# Patient Record
Sex: Female | Born: 1985 | Race: Black or African American | Hispanic: No | Marital: Married | Smoking: Former smoker
Health system: Southern US, Community
[De-identification: ages and names within clinical notes are randomized; demographics above are authoritative.]

## PROBLEM LIST (undated history)

## (undated) ENCOUNTER — Emergency Department (HOSPITAL_COMMUNITY): Disposition: A | Discharge status: Home / Self Care | Payer: Medicaid Other

## (undated) DIAGNOSIS — O34219 Maternal care for unspecified type scar from previous cesarean delivery: Secondary | ICD-10-CM

## (undated) DIAGNOSIS — N751 Abscess of Bartholin's gland: Secondary | ICD-10-CM

## (undated) DIAGNOSIS — I10 Essential (primary) hypertension: Secondary | ICD-10-CM

## (undated) DIAGNOSIS — O24419 Gestational diabetes mellitus in pregnancy, unspecified control: Secondary | ICD-10-CM

## (undated) DIAGNOSIS — A599 Trichomoniasis, unspecified: Secondary | ICD-10-CM

## (undated) DIAGNOSIS — N921 Excessive and frequent menstruation with irregular cycle: Secondary | ICD-10-CM

## (undated) HISTORY — DX: Excessive and frequent menstruation with irregular cycle: N92.1

## (undated) HISTORY — PX: WISDOM TOOTH EXTRACTION: SHX21

## (undated) HISTORY — DX: Abscess of Bartholin's gland: N75.1

## (undated) HISTORY — DX: Maternal care for unspecified type scar from previous cesarean delivery: O34.219

## (undated) HISTORY — DX: Gestational diabetes mellitus in pregnancy, unspecified control: O24.419

---

## 2010-10-09 ENCOUNTER — Inpatient Hospital Stay (HOSPITAL_COMMUNITY)
Admission: AD | Admit: 2010-10-09 | Discharge: 2010-10-09 | Disposition: A | Discharge status: Home / Self Care | Payer: Medicaid Other | Source: Ambulatory Visit | Attending: Obstetrics & Gynecology | Admitting: Obstetrics & Gynecology

## 2010-10-09 ENCOUNTER — Encounter (HOSPITAL_COMMUNITY): Payer: Self-pay | Admitting: Radiology

## 2010-10-09 ENCOUNTER — Inpatient Hospital Stay (HOSPITAL_COMMUNITY): Payer: Medicaid Other

## 2010-10-09 DIAGNOSIS — R109 Unspecified abdominal pain: Secondary | ICD-10-CM | POA: Insufficient documentation

## 2010-10-09 DIAGNOSIS — O00109 Unspecified tubal pregnancy without intrauterine pregnancy: Secondary | ICD-10-CM

## 2010-10-09 LAB — HCG, QUANTITATIVE, PREGNANCY: hCG, Beta Chain, Quant, S: 389 m[IU]/mL — ABNORMAL HIGH (ref <5)

## 2010-10-09 LAB — DIFFERENTIAL
Basophils Absolute: 0 10*3/uL (ref 0.0–0.1)
Lymphocytes Relative: 26 % (ref 12–46)
Monocytes Absolute: 0.8 10*3/uL (ref 0.1–1.0)
Monocytes Relative: 7 % (ref 3–12)
Neutro Abs: 8 10*3/uL — ABNORMAL HIGH (ref 1.7–7.7)
Neutrophils Relative %: 66 % (ref 43–77)

## 2010-10-09 LAB — CREATININE, SERUM
Creatinine, Ser: 0.59 mg/dL (ref 0.4–1.2)
GFR calc Af Amer: >60 mL/min (ref >60)

## 2010-10-09 LAB — WET PREP, GENITAL
Trich, Wet Prep: NONE SEEN
Yeast Wet Prep HPF POC: NONE SEEN

## 2010-10-09 LAB — URINALYSIS, ROUTINE W REFLEX MICROSCOPIC
Bilirubin Urine: NEGATIVE
Ketones, ur: NEGATIVE mg/dL
Nitrite: POSITIVE — AB
Protein, ur: NEGATIVE mg/dL
Urobilinogen, UA: 0.2 mg/dL (ref 0.0–1.0)

## 2010-10-09 LAB — CBC
HCT: 37 % (ref 36.0–46.0)
Platelets: 263 10*3/uL (ref 150–400)
RDW: 13.4 % (ref 11.5–15.5)
WBC: 11.5 10*3/uL — ABNORMAL HIGH (ref 4.0–10.5)

## 2010-10-09 LAB — ABO/RH: ABO/RH(D): B POS

## 2010-10-09 LAB — BUN: BUN: 8 mg/dL (ref 6–23)

## 2010-10-09 LAB — POCT PREGNANCY, URINE: Preg Test, Ur: POSITIVE

## 2010-10-10 LAB — GC/CHLAMYDIA PROBE AMP, GENITAL
Chlamydia, DNA Probe: NEGATIVE
GC Probe Amp, Genital: NEGATIVE

## 2010-10-11 LAB — URINE CULTURE
Colony Count: >=100000
Culture  Setup Time: 201204171852

## 2010-10-12 ENCOUNTER — Inpatient Hospital Stay (HOSPITAL_COMMUNITY)
Admission: AD | Admit: 2010-10-12 | Discharge: 2010-10-12 | Disposition: A | Discharge status: Home / Self Care | Payer: Medicaid Other | Source: Ambulatory Visit | Attending: Obstetrics and Gynecology | Admitting: Obstetrics and Gynecology

## 2010-10-12 DIAGNOSIS — O00109 Unspecified tubal pregnancy without intrauterine pregnancy: Secondary | ICD-10-CM | POA: Insufficient documentation

## 2012-02-10 ENCOUNTER — Inpatient Hospital Stay (HOSPITAL_COMMUNITY)
Admission: AD | Admit: 2012-02-10 | Discharge: 2012-02-10 | Disposition: A | Discharge status: Home / Self Care | Payer: Self-pay | Source: Ambulatory Visit | Attending: Obstetrics & Gynecology | Admitting: Obstetrics & Gynecology

## 2012-02-10 ENCOUNTER — Encounter (HOSPITAL_COMMUNITY): Payer: Self-pay | Admitting: *Deleted

## 2012-02-10 DIAGNOSIS — A084 Viral intestinal infection, unspecified: Secondary | ICD-10-CM

## 2012-02-10 DIAGNOSIS — N921 Excessive and frequent menstruation with irregular cycle: Secondary | ICD-10-CM | POA: Diagnosis present

## 2012-02-10 DIAGNOSIS — A088 Other specified intestinal infections: Secondary | ICD-10-CM | POA: Insufficient documentation

## 2012-02-10 HISTORY — DX: Excessive and frequent menstruation with irregular cycle: N92.1

## 2012-02-10 LAB — WET PREP, GENITAL: Trich, Wet Prep: NONE SEEN

## 2012-02-10 LAB — CBC
MCH: 32.3 pg (ref 26.0–34.0)
MCHC: 32.3 g/dL (ref 30.0–36.0)
MCV: 100 fL (ref 78.0–100.0)
Platelets: 284 10*3/uL (ref 150–400)
RBC: 4 MIL/uL (ref 3.87–5.11)
RDW: 13.3 % (ref 11.5–15.5)

## 2012-02-10 MED ORDER — ONDANSETRON HCL 4 MG PO TABS: 4.0000 mg | ORAL_TABLET | Freq: Three times a day (TID) | ORAL | Status: AC | PRN

## 2012-02-10 NOTE — MAU Note (Signed)
WAS AT CLINIC IN 2012- STD CHECK- AND BC PILLS - BUT GOT PREG WITH ECTOPIC

## 2012-02-10 NOTE — MAU Note (Signed)
PINK ON TISSUE TODAY.

## 2012-02-10 NOTE — MAU Note (Signed)
PT SAYS ON 7-23- SHE STARTED  FEELING   N/V-    CRAMPING- FEELS FLUTTER,  SLEEPY,  FREQ VOIDING.   ,   AUG CYCLE - WAS NOT NL- HAD CRAMPS ONLY X1 HR.

## 2012-02-10 NOTE — MAU Provider Note (Signed)
Cynthia Schultz y.o.G2P0 @Unknown  by LMP Chief Complaint  Patient presents with  . Nausea     First Provider Initiated Contact with Patient 02/10/12 2312      SUBJECTIVE  HPI: Pt presents to MAU with nausea since June 21, diarrhea x3 weeks, and left flank and left mid abdomen pain at umbilicus.  Patient's last menstrual period was 01/14/2012.  This period was shorter and lighter and only 2 weeks after her normal period.  She had light bleeding on 8/13 also but not a normal period for her.  She had these symptoms in her first pregnancy so she took a pregnancy test 2 weeks ago which was positive.  After this test, she has had 3 negative home pregnancy tests.    Past Medical History  Diagnosis Date  . Complication of anesthesia    Past Surgical History  Procedure Date  . Cesarean section    History   Social History  . Marital Status: Single    Spouse Name: N/A    Number of Children: N/A  . Years of Education: N/A   Occupational History  . Not on file.   Social History Main Topics  . Smoking status: Current Everyday Smoker -- 1.0 packs/day    Types: Cigarettes  . Smokeless tobacco: Not on file  . Alcohol Use: 1.8 oz/week    1 Glasses of wine, 1 Cans of beer, 1 Shots of liquor per week     LAST TIME WA SAT  . Drug Use: Yes    Special: Marijuana  . Sexually Active: Yes   Other Topics Concern  . Not on file   Social History Narrative  . No narrative on file   No current facility-administered medications on file prior to encounter.   No current outpatient prescriptions on file prior to encounter.   No Known Allergies  ROS: Pertinent items in HPI  OBJECTIVE Blood pressure 123/78, pulse 116, temperature 98 F (36.7 C), temperature source Oral, resp. rate 20, height 5\' 6"  (1.676 m), weight 99.961 kg (220 lb 6 oz), last menstrual period 01/14/2012, unknown if currently breastfeeding.  GENERAL: Well-developed, well-nourished female in no acute distress.  HEENT:  Normocephalic, good dentition HEART: normal rate RESP: normal effort ABDOMEN: Soft, nontender EXTREMITIES: Nontender, no edema NEURO: Alert and oriented Pelvic exam: Cervix pink, visually closed, without lesion, scant white creamy discharge, vaginal walls and external genitalia normal Bimanual exam: Cervix 0/long/high, firm, anterior, neg CMT, uterus nontender, nonenlarged, adnexa without tenderness, enlargement, or mass  Neg CVA tenderness  LAB RESULTS  Results for orders placed during the hospital encounter of 02/10/12 (from the past 24 hour(s))  POCT PREGNANCY, URINE     Status: Normal   Collection Time   02/10/12  9:36 PM      Component Value Range   Preg Test, Ur NEGATIVE  NEGATIVE   Results for orders placed during the hospital encounter of 02/10/12 (from the past 72 hour(s))  POCT PREGNANCY, URINE     Status: Normal   Collection Time   02/10/12  9:36 PM      Component Value Range Comment   Preg Test, Ur NEGATIVE  NEGATIVE   GC/CHLAMYDIA PROBE AMP, GENITAL     Status: Normal   Collection Time   02/10/12 11:13 PM      Component Value Range Comment   GC Probe Amp, Genital NEGATIVE  NEGATIVE    Chlamydia, DNA Probe NEGATIVE  NEGATIVE   WET PREP, GENITAL     Status: Abnormal  Collection Time   02/10/12 11:13 PM      Component Value Range Comment   Yeast Wet Prep HPF POC NONE SEEN  NONE SEEN    Trich, Wet Prep NONE SEEN  NONE SEEN    Clue Cells Wet Prep HPF POC MANY (*) NONE SEEN    WBC, Wet Prep HPF POC MODERATE (*) NONE SEEN MODERATE BACTERIA SEEN  HCG, QUANTITATIVE, PREGNANCY     Status: Normal   Collection Time   02/10/12 11:15 PM      Component Value Range Comment   hCG, Beta Chain, Quant, S <1  <5 mIU/mL   CBC     Status: Abnormal   Collection Time   02/10/12 11:15 PM      Component Value Range Comment   WBC 11.3 (*) 4.0 - 10.5 K/uL    RBC 4.00  3.87 - 5.11 MIL/uL    Hemoglobin 12.9  12.0 - 15.0 g/dL    HCT 16.1  09.6 - 04.5 %    MCV 100.0  78.0 - 100.0 fL     MCH 32.3  26.0 - 34.0 pg    MCHC 32.3  30.0 - 36.0 g/dL    RDW 40.9  81.1 - 91.4 %    Platelets 284  150 - 400 K/uL     ASSESSMENT 1. Viral gastroenteritis   2. Metrorrhagia     PLAN  D/C home Zofran 4-8 mg PO Q 8 hours F/U with primary care Bland GI upset diet and drink plenty of fluids Return to MAU as needed  Medication List  As of 02/13/2012  9:22 AM   TAKE these medications         ondansetron 4 MG tablet   Commonly known as: ZOFRAN   Take 1-2 tablets (4-8 mg total) by mouth every 8 (eight) hours as needed for nausea.             LEFTWICH-KIRBY, LISA 02/10/2012 11:19 PM

## 2012-02-10 NOTE — MAU Note (Signed)
PT WANTED TO LEAVE -  FOR WORK IN AM

## 2012-02-10 NOTE — MAU Note (Signed)
HAD 1 ECTOPIC IN 09-2010-      HAD METHOTREXATE-  NO DR VISIT  SINCE.Marland Kitchen  HAD 1 POSTIVE PREG TEST  ON 2 WEEKS AGO - THEN 3 OTHER  WERE NEG.  NO BIRTH CONTROL.  LAST SEX- 2 WEEKS AGO.

## 2012-02-11 LAB — GC/CHLAMYDIA PROBE AMP, GENITAL
Chlamydia, DNA Probe: NEGATIVE
GC Probe Amp, Genital: NEGATIVE

## 2012-02-20 NOTE — MAU Provider Note (Signed)
Medical Screening exam and patient care preformed by advanced practice provider.  Agree with the above management.  

## 2012-02-28 ENCOUNTER — Encounter: Payer: Medicaid Other | Admitting: Advanced Practice Midwife

## 2014-04-25 ENCOUNTER — Encounter (HOSPITAL_COMMUNITY): Payer: Self-pay | Admitting: *Deleted

## 2016-01-08 ENCOUNTER — Encounter (HOSPITAL_COMMUNITY): Payer: Self-pay

## 2016-01-08 ENCOUNTER — Inpatient Hospital Stay (HOSPITAL_COMMUNITY)
Admission: AD | Admit: 2016-01-08 | Discharge: 2016-01-08 | Disposition: A | Discharge status: Home / Self Care | Payer: Self-pay | Source: Ambulatory Visit | Attending: Obstetrics and Gynecology | Admitting: Obstetrics and Gynecology

## 2016-01-08 DIAGNOSIS — N751 Abscess of Bartholin's gland: Secondary | ICD-10-CM

## 2016-01-08 DIAGNOSIS — F17210 Nicotine dependence, cigarettes, uncomplicated: Secondary | ICD-10-CM | POA: Insufficient documentation

## 2016-01-08 DIAGNOSIS — I10 Essential (primary) hypertension: Secondary | ICD-10-CM | POA: Insufficient documentation

## 2016-01-08 HISTORY — DX: Essential (primary) hypertension: I10

## 2016-01-08 MED ORDER — OXYCODONE-ACETAMINOPHEN 5-325 MG PO TABS: 1.0000 | ORAL_TABLET | Freq: Once | ORAL | Status: DC

## 2016-01-08 MED ORDER — LIDOCAINE HCL 2 % EX GEL
1.0000 "application " | Freq: Once | CUTANEOUS | Status: AC
  Administered 2016-01-08: 1 via TOPICAL
  Filled 2016-01-08: qty 5

## 2016-01-08 MED ORDER — OXYCODONE-ACETAMINOPHEN 5-325 MG PO TABS: 2.0000 | ORAL_TABLET | Freq: Once | ORAL | Status: DC

## 2016-01-08 NOTE — Discharge Instructions (Signed)
Bartholin Cyst or Abscess A Bartholin cyst is a fluid-filled sac that forms on a Bartholin gland. Bartholin glands are small glands that are located within the folds of skin (labia) along the sides of the lower opening of the vagina. These glands produce a fluid to moisten the outside of the vagina during sexual intercourse. A Bartholin cyst causes a bulge on the side of the vagina. A cyst that is not large or infected may not cause symptoms or problems. However, if the fluid within the cyst becomes infected, the cyst can turn into an abscess. An abscess may cause discomfort or pain. CAUSES A Bartholin cyst may develop when the duct of the gland becomes blocked. In many cases, the cause of this is not known. Various kinds of bacteria can cause the cyst to become infected and develop into an abscess. RISK FACTORS You may be at an increased risk of developing a Bartholin cyst or abscess if:  You are a woman of reproductive age.  You have a history of previous Bartholin cysts or abscesses.  You have diabetes.  You have a sexually transmitted disease (STD). SIGNS AND SYMPTOMS The severity of symptoms varies depending on the size of the cyst and whether it is infected. Symptoms may include:  A bulge or swelling near the lower opening of your vagina.  Discomfort or pain.  Redness.  Pain during sexual intercourse.  Pain when walking.  Fluid draining from the area. DIAGNOSIS Your health care provider may make a diagnosis based on your symptoms and a physical exam. He or she will look for swelling in your vaginal area. Blood tests may be done to check for infections. A sample of fluid from the cyst or abscess may also be taken to be tested in a lab. TREATMENT Small cysts that are not infected may not require any treatment. These often go away on their own. Yourhealth care provider will recommend hot baths and the use of warm compresses. These may also be part of the treatment for an abscess.  Treatment options for a large cyst or abscess may include:   Antibiotic medicine.  A surgical procedure to drain the abscess. One of the following procedures may be done:  Incision and drainage. An incision is made in the cyst or abscess so that the fluid drains out. A catheter may be placed inside the cyst so that it does not close and fill up with fluid again. The catheter will be removed after you have a follow-up visit with a specialist (gynecologist).  Marsupialization. The cyst or abscess is opened and kept open by stitching the edges of the skin to the walls of the cyst or abscess. This allows it to continue to drain and not fill up with fluid again. If you have cysts or abscesses that keep returning and have required incision and drainage multiple times, your health care provider may talk to you about surgery to remove the Bartholin gland. HOME CARE INSTRUCTIONS  Take medicines only as directed by your health care provider.  If you were prescribed an antibiotic medicine, finish it all even if you start to feel better.  Apply warm, wet compresses to the area or take warm, shallow baths that cover your pelvic region (sitz baths) several times a day or as directed by your health care provider.  Do not squeeze the cyst or apply heavy pressure to it.  Do not have sexual intercourse until the cyst has gone away.  If your cyst or abscess was   opened, a small piece of gauze or a drain may have been placed in the area to allow drainage. Do not remove the gauze or the drain until directed by your health care provider.  Wear feminine pads--not tampons--as needed for any drainage or bleeding.  Keep all follow-up visits as directed by your health care provider. This is important. PREVENTION Take these steps to help prevent a Bartholin cyst from returning:  Practice good hygiene.   Clean your vaginal area with mild soap and a soft cloth when you bathe.  Practice safe sex to prevent  STDs. SEEK MEDICAL CARE IF:  You have increased pain, swelling, or redness in the area of the cyst.  Puslike drainage is coming from the cyst.  You have a fever.   This information is not intended to replace advice given to you by your health care provider. Make sure you discuss any questions you have with your health care provider.   Document Released: 06/10/2005 Document Revised: 07/01/2014 Document Reviewed: 01/24/2014 Elsevier Interactive Patient Education 2016 [employer] Inc[employer]  

## 2016-01-08 NOTE — MAU Note (Signed)
Informed consent signed by patient 

## 2016-01-08 NOTE — MAU Provider Note (Signed)
History     CSN: [id number]  Arrival date and time: 01/08/16 2100   First Provider Initiated Contact with Patient 01/08/16 2126      Chief Complaint  Patient presents with  . Vaginal Pain   HPI    [name] is a 30 y.o. female G13P1001 non-pregnant female here with a left bartholin's gland. She noted swelling and pain in that area 4 days ago. She has never had this happen before. She denies fever. She has not taken anything for the pain. She has tried warm compresses to the area.   OB History    Gravida Para Term Preterm AB TAB SAB Ectopic Multiple Living   Past Medical History  Diagnosis Date  . Complication of anesthesia   . Hypertension     Past Surgical History  Procedure Laterality Date  . Cesarean section      Family History  Problem Relation Age of Onset  . Diabetes Mother   . Cancer Mother     Social History  Substance Use Topics  . Smoking status: Current Every Day Smoker -- 1.00 packs/day    Types: Cigarettes  . Smokeless tobacco: None  . Alcohol Use: 1.8 oz/week    1 Glasses of wine, 1 Cans of beer, 1 Shots of liquor per week     Comment: LAST TIME [facility] SAT    Allergies: No Known Allergies  Prescriptions prior to admission  Medication Sig Dispense Refill Last Dose  . acetaminophen (TYLENOL) 500 MG tablet Take 1,000 mg by mouth every 6 (six) hours as needed for mild pain or headache.   01/07/2016 at Unknown time   No results found for this or any previous visit (from the past 48 hour(s)).  Review of Systems  Constitutional: Negative for fever and chills.  Gastrointestinal: Negative for nausea and vomiting.   Physical Exam   Blood pressure 153/105, pulse 106, temperature 98.6 F (37 C), temperature source Oral, resp. rate 20, height  (1.702 m), weight 222 lb (100.699 kg), last menstrual period 12/20/2015, SpO2 97 %, unknown if currently breastfeeding.  Physical Exam  Constitutional: She is oriented to person,  place, and time. She appears well-developed and well-nourished. No distress.  HENT:  Head: Normocephalic.  Eyes: Pupils are equal, round, and reactive to light.  Genitourinary:    There is tenderness on the left labia.  Golf ball size, left labial bartholin's gland abscess.   Musculoskeletal: Normal range of motion.  Neurological: She is alert and oriented to person, place, and time.  Skin: Skin is warm. She is not diaphoretic.  Psychiatric: Her behavior is normal.    MAU Course  Procedures  None  Consent form signed. Time out.   Patient positioned and draped with sterile towels. Preoperative medication: Lidocaine gel applied to the area. Percocet 1 tablet po- patient declined  Area cleaned with betadine Local infiltrate with lidocaine 1%. Amount 2 ccs   I&D Scalpel size: #11blade Incision type: Straight single Complexity: Complex Drained moderate- large amount of purulent/bloody drainage Probed with curved hemostat to break up loculations  Wound treatment: Word Catheter placed and inflated with 3 cc. Of NS Packing: none Patient tolerance: Tolerated procedure well.   MDM  Patient requests pain medication prior to Dc home.  Percocet 1 tab given   Assessment and Plan    A:  1. Bartholin's gland abscess     P:  Discharge home  in stable condition Rx: percocet  Return to MAU for emergencies only Follow up with the [facility] in 4-6 weeks to have ward catheter removed; message sent to the [facility] for scheduling     Duane LopeJennifer I Rasch, NP 01/09/2016 9:42 AM

## 2016-01-08 NOTE — MAU Note (Signed)
Pt c/o cyst on vagina x4 days but last night the pain got worse and it got much bigger. Has tried hot compresses-helps a little. Took extra strength tylenol yesterday for HA, but it did seem to help the pain. Denies hx of bartholin's cyst. Denies fever. LMP:12/20/2015.

## 2016-01-09 ENCOUNTER — Encounter (HOSPITAL_COMMUNITY): Payer: Self-pay | Admitting: Obstetrics and Gynecology

## 2016-02-21 ENCOUNTER — Encounter: Payer: Self-pay | Admitting: Obstetrics and Gynecology

## 2016-02-22 ENCOUNTER — Ambulatory Visit (INDEPENDENT_AMBULATORY_CARE_PROVIDER_SITE_OTHER): Payer: Self-pay | Admitting: Obstetrics & Gynecology

## 2016-02-22 ENCOUNTER — Encounter: Payer: Self-pay | Admitting: Obstetrics & Gynecology

## 2016-02-22 ENCOUNTER — Encounter: Payer: Self-pay | Admitting: General Practice

## 2016-02-22 DIAGNOSIS — N751 Abscess of Bartholin's gland: Secondary | ICD-10-CM

## 2016-02-22 HISTORY — DX: Abscess of Bartholin's gland: N75.1

## 2016-02-22 NOTE — Progress Notes (Signed)
CC: Word catheter to be removed  Arrival date and time: 01/08/16 2100   First Provider Initiated Contact with Patient 01/08/16 2126         Chief Complaint  Patient presents with  . Vaginal Pain   HPI    [name] is a 30 y.o. female G86P1001 non-pregnant female here with a left bartholin's gland. She noted swelling and pain in that area 4 days ago. She has never had this happen before. She denies fever. She has not taken anything for the pain. She has tried warm compresses to the area.   OB History    Gravida Para Term Preterm AB TAB SAB Ectopic Multiple Living   2 1 1       1           Past Medical History  Diagnosis Date  . Complication of anesthesia   . Hypertension          Past Surgical History  Procedure Laterality Date  . Cesarean section           Family History  Problem Relation Age of Onset  . Diabetes Mother   . Cancer Mother           Social History  Substance Use Topics  . Smoking status: Current Every Day Smoker -- 1.00 packs/day    Types: Cigarettes  . Smokeless tobacco: None  . Alcohol Use: 1.8 oz/week    1 Glasses of wine, 1 Cans of beer, 1 Shots of liquor per week     Comment: LAST TIME WA SAT    Allergies: No Known Allergies         Prescriptions prior to admission  Medication Sig Dispense Refill Last Dose  . acetaminophen (TYLENOL) 500 MG tablet Take 1,000 mg by mouth every 6 (six) hours as needed for mild pain or headache.   01/07/2016 at Unknown time   LabResultsLast48Hours  No results found for this or any previous visit (from the past 48 hour(s)).    Review of Systems  Constitutional: Negative for fever and chills.  Gastrointestinal: Negative for nausea and vomiting.   Physical Exam   Blood pressure 153/105, pulse 106, temperature 98.6 F (37 C), temperature source Oral, resp. rate 20, height 5\' 7"  (1.702 m), weight 222 lb (100.699 kg), last menstrual period 12/20/2015,  SpO2 97 %, unknown if currently breastfeeding.  Physical Exam  Constitutional: She is oriented to person, place, and time. She appears well-developed and well-nourished. No distress.  HENT:  Head: Normocephalic.  Eyes: Pupils are equal, round, and reactive to light.  Genitourinary:    There is tenderness on the left labia.  Golf ball size, left labial bartholin's gland abscess.   Musculoskeletal: Normal range of motion.  Neurological: She is alert and oriented to person, place, and time.  Skin: Skin is warm. She is not diaphoretic.  Psychiatric: Her behavior is normal.    MAU Course  Procedures  None  Consent form signed. Time out.   Patient positioned and draped with sterile towels. Preoperative medication: Lidocaine gel applied to the area. Percocet 1 tablet po- patient declined  Area cleaned with betadine Local infiltrate with lidocaine 1%. Amount 2 ccs   I&D Scalpel size: #11blade Incision type: Straight single Complexity: Complex Drained moderate- large amount of purulent/bloody drainage Probed with curved hemostat to break up loculations  Wound treatment: Word Catheter placed and inflated with 3 cc. Of NS Packing: none Patient tolerance: Tolerated procedure well.   MDM  Patient requests  pain medication prior to Dc home.  Percocet 1 tab given   Assessment and Plan    A:  1. Bartholin's gland abscess     P:  Discharge home in stable condition Rx: percocet  Return to MAU for emergencies only Follow up with the [facility] in 4-6 weeks to have ward catheter removed; message sent to the [facility] for scheduling     Duane LopeJennifer I Rasch, NP 01/09/2016 9:42 AM Patient did well and needs catheter removed. No pain or drainage  Word catheter left posterior vulva cut and remove with no difficulty  Imp S/P I&D Bartholin's abscess  Plan Report recurrent sx  Adam PhenixJames G Kelty Szafran, MD 02/22/2016

## 2016-02-22 NOTE — Patient Instructions (Signed)
Bartholin Cyst or Abscess A Bartholin cyst is a fluid-filled sac that forms on a Bartholin gland. Bartholin glands are small glands that are located within the folds of skin (labia) along the sides of the lower opening of the vagina. These glands produce a fluid to moisten the outside of the vagina during sexual intercourse. A Bartholin cyst causes a bulge on the side of the vagina. A cyst that is not large or infected may not cause symptoms or problems. However, if the fluid within the cyst becomes infected, the cyst can turn into an abscess. An abscess may cause discomfort or pain. CAUSES A Bartholin cyst may develop when the duct of the gland becomes blocked. In many cases, the cause of this is not known. Various kinds of bacteria can cause the cyst to become infected and develop into an abscess. RISK FACTORS You may be at an increased risk of developing a Bartholin cyst or abscess if:  You are a woman of reproductive age.  You have a history of previous Bartholin cysts or abscesses.  You have diabetes.  You have a sexually transmitted disease (STD). SIGNS AND SYMPTOMS The severity of symptoms varies depending on the size of the cyst and whether it is infected. Symptoms may include:  A bulge or swelling near the lower opening of your vagina.  Discomfort or pain.  Redness.  Pain during sexual intercourse.  Pain when walking.  Fluid draining from the area. DIAGNOSIS Your health care provider may make a diagnosis based on your symptoms and a physical exam. He or she will look for swelling in your vaginal area. Blood tests may be done to check for infections. A sample of fluid from the cyst or abscess may also be taken to be tested in a lab. TREATMENT Small cysts that are not infected may not require any treatment. These often go away on their own. Yourhealth care provider will recommend hot baths and the use of warm compresses. These may also be part of the treatment for an abscess.  Treatment options for a large cyst or abscess may include:   Antibiotic medicine.  A surgical procedure to drain the abscess. One of the following procedures may be done:  Incision and drainage. An incision is made in the cyst or abscess so that the fluid drains out. A catheter may be placed inside the cyst so that it does not close and fill up with fluid again. The catheter will be removed after you have a follow-up visit with a specialist (gynecologist).  Marsupialization. The cyst or abscess is opened and kept open by stitching the edges of the skin to the walls of the cyst or abscess. This allows it to continue to drain and not fill up with fluid again. If you have cysts or abscesses that keep returning and have required incision and drainage multiple times, your health care provider may talk to you about surgery to remove the Bartholin gland. HOME CARE INSTRUCTIONS  Take medicines only as directed by your health care provider.  If you were prescribed an antibiotic medicine, finish it all even if you start to feel better.  Apply warm, wet compresses to the area or take warm, shallow baths that cover your pelvic region (sitz baths) several times a day or as directed by your health care provider.  Do not squeeze the cyst or apply heavy pressure to it.  Do not have sexual intercourse until the cyst has gone away.  If your cyst or abscess was   opened, a small piece of gauze or a drain may have been placed in the area to allow drainage. Do not remove the gauze or the drain until directed by your health care provider.  Wear feminine pads--not tampons--as needed for any drainage or bleeding.  Keep all follow-up visits as directed by your health care provider. This is important. PREVENTION Take these steps to help prevent a Bartholin cyst from returning:  Practice good hygiene.   Clean your vaginal area with mild soap and a soft cloth when you bathe.  Practice safe sex to prevent  STDs. SEEK MEDICAL CARE IF:  You have increased pain, swelling, or redness in the area of the cyst.  Puslike drainage is coming from the cyst.  You have a fever.   This information is not intended to replace advice given to you by your health care provider. Make sure you discuss any questions you have with your health care provider.   Document Released: 06/10/2005 Document Revised: 07/01/2014 Document Reviewed: 01/24/2014 Elsevier Interactive Patient Education 2016 [employer]  

## 2016-11-21 ENCOUNTER — Ambulatory Visit (INDEPENDENT_AMBULATORY_CARE_PROVIDER_SITE_OTHER): Payer: Self-pay | Admitting: *Deleted

## 2016-11-21 ENCOUNTER — Encounter: Payer: Self-pay | Admitting: Obstetrics & Gynecology

## 2016-11-21 VITALS — BP 155/81 | HR 88

## 2016-11-21 DIAGNOSIS — O0990 Supervision of high risk pregnancy, unspecified, unspecified trimester: Secondary | ICD-10-CM

## 2016-11-21 DIAGNOSIS — Z3200 Encounter for pregnancy test, result unknown: Secondary | ICD-10-CM

## 2016-11-21 DIAGNOSIS — Z3201 Encounter for pregnancy test, result positive: Secondary | ICD-10-CM

## 2016-11-21 LAB — POCT PREGNANCY, URINE: PREG TEST UR: POSITIVE — AB

## 2016-11-21 MED ORDER — PRENATAL VITAMINS 28-0.8 MG PO TABS: 1.0000 | ORAL_TABLET | Freq: Every day | ORAL | 6 refills | Status: DC

## 2016-11-21 NOTE — Progress Notes (Signed)
Here for pregancy test which was positive. LMP 10/06/16 approximately- states was either that day or a day or two before/after. Would like to start care here. Reviewed meds with her, pnv rx sent. Due to elevated bp today asked manager to get new ob appt asap. Appt made for 11/28/16 1:40

## 2016-11-28 ENCOUNTER — Encounter: Payer: Self-pay | Admitting: Obstetrics and Gynecology

## 2016-12-02 ENCOUNTER — Encounter: Payer: Self-pay | Admitting: Family Medicine

## 2016-12-02 ENCOUNTER — Ambulatory Visit: Payer: Self-pay

## 2016-12-02 ENCOUNTER — Ambulatory Visit (INDEPENDENT_AMBULATORY_CARE_PROVIDER_SITE_OTHER): Payer: Self-pay | Admitting: Family Medicine

## 2016-12-02 VITALS — BP 140/85 | HR 81 | Wt 210.9 lb

## 2016-12-02 DIAGNOSIS — Z1151 Encounter for screening for human papillomavirus (HPV): Secondary | ICD-10-CM

## 2016-12-02 DIAGNOSIS — O0990 Supervision of high risk pregnancy, unspecified, unspecified trimester: Secondary | ICD-10-CM | POA: Insufficient documentation

## 2016-12-02 DIAGNOSIS — O10911 Unspecified pre-existing hypertension complicating pregnancy, first trimester: Secondary | ICD-10-CM

## 2016-12-02 DIAGNOSIS — O34219 Maternal care for unspecified type scar from previous cesarean delivery: Secondary | ICD-10-CM | POA: Insufficient documentation

## 2016-12-02 DIAGNOSIS — A5901 Trichomonal vulvovaginitis: Secondary | ICD-10-CM

## 2016-12-02 DIAGNOSIS — O10919 Unspecified pre-existing hypertension complicating pregnancy, unspecified trimester: Secondary | ICD-10-CM | POA: Insufficient documentation

## 2016-12-02 DIAGNOSIS — Z124 Encounter for screening for malignant neoplasm of cervix: Secondary | ICD-10-CM

## 2016-12-02 DIAGNOSIS — O23591 Infection of other part of genital tract in pregnancy, first trimester: Secondary | ICD-10-CM

## 2016-12-02 DIAGNOSIS — B3731 Acute candidiasis of vulva and vagina: Secondary | ICD-10-CM

## 2016-12-02 DIAGNOSIS — I10 Essential (primary) hypertension: Secondary | ICD-10-CM | POA: Insufficient documentation

## 2016-12-02 DIAGNOSIS — B373 Candidiasis of vulva and vagina: Secondary | ICD-10-CM

## 2016-12-02 DIAGNOSIS — O3680X0 Pregnancy with inconclusive fetal viability, not applicable or unspecified: Secondary | ICD-10-CM

## 2016-12-02 DIAGNOSIS — Z113 Encounter for screening for infections with a predominantly sexual mode of transmission: Secondary | ICD-10-CM

## 2016-12-02 DIAGNOSIS — O0991 Supervision of high risk pregnancy, unspecified, first trimester: Secondary | ICD-10-CM

## 2016-12-02 LAB — POCT URINALYSIS DIP (DEVICE)
Bilirubin Urine: NEGATIVE
Glucose, UA: NEGATIVE mg/dL
HGB URINE DIPSTICK: NEGATIVE
Ketones, ur: NEGATIVE mg/dL
Nitrite: NEGATIVE
PH: 7 (ref 5.0–8.0)
Protein, ur: NEGATIVE mg/dL
SPECIFIC GRAVITY, URINE: 1.02 (ref 1.005–1.030)
UROBILINOGEN UA: 0.2 mg/dL (ref 0.0–1.0)

## 2016-12-02 NOTE — Patient Instructions (Signed)
 First Trimester of Pregnancy The first trimester of pregnancy is from week 1 until the end of week 13 (months 1 through 3). A week after a sperm fertilizes an egg, the egg will implant on the wall of the uterus. This embryo will begin to develop into a baby. Genes from you and your partner will form the baby. The female genes will determine whether the baby will be a boy or a girl. At 6-8 weeks, the eyes and face will be formed, and the heartbeat can be seen on ultrasound. At the end of 12 weeks, all the baby's organs will be formed. Now that you are pregnant, you will want to do everything you can to have a healthy baby. Two of the most important things are to get good prenatal care and to follow your health care provider's instructions. Prenatal care is all the medical care you receive before the baby's birth. This care will help prevent, find, and treat any problems during the pregnancy and childbirth. Body changes during your first trimester Your body goes through many changes during pregnancy. The changes vary from woman to woman.  You may gain or lose a couple of pounds at first.  You may feel sick to your stomach (nauseous) and you may throw up (vomit). If the vomiting is uncontrollable, call your health care provider.  You may tire easily.  You may develop headaches that can be relieved by medicines. All medicines should be approved by your health care provider.  You may urinate more often. Painful urination may mean you have a bladder infection.  You may develop heartburn as a result of your pregnancy.  You may develop constipation because certain hormones are causing the muscles that push stool through your intestines to slow down.  You may develop hemorrhoids or swollen veins (varicose veins).  Your breasts may begin to grow larger and become tender. Your nipples may stick out more, and the tissue that surrounds them (areola) may become darker.  Your gums may bleed and may be  sensitive to brushing and flossing.  Dark spots or blotches (chloasma, mask of pregnancy) may develop on your face. This will likely fade after the baby is born.  Your menstrual periods will stop.  You may have a loss of appetite.  You may develop cravings for certain kinds of food.  You may have changes in your emotions from day to day, such as being excited to be pregnant or being concerned that something may go wrong with the pregnancy and baby.  You may have more vivid and strange dreams.  You may have changes in your hair. These can include thickening of your hair, rapid growth, and changes in texture. Some women also have hair loss during or after pregnancy, or hair that feels dry or thin. Your hair will most likely return to normal after your baby is born.  What to expect at prenatal visits During a routine prenatal visit:  You will be weighed to make sure you and the baby are growing normally.  Your blood pressure will be taken.  Your abdomen will be measured to track your baby's growth.  The fetal heartbeat will be listened to between weeks 10 and 14 of your pregnancy.  Test results from any previous visits will be discussed.  Your health care provider may ask you:  How you are feeling.  If you are feeling the baby move.  If you have had any abnormal symptoms, such as leaking fluid, bleeding, severe   headaches, or abdominal cramping.  If you are using any tobacco products, including cigarettes, chewing tobacco, and electronic cigarettes.  If you have any questions.  Other tests that may be performed during your first trimester include:  Blood tests to find your blood type and to check for the presence of any previous infections. The tests will also be used to check for low iron levels (anemia) and protein on red blood cells (Rh antibodies). Depending on your risk factors, or if you previously had diabetes during pregnancy, you may have tests to check for high blood  sugar that affects pregnant women (gestational diabetes).  Urine tests to check for infections, diabetes, or protein in the urine.  An ultrasound to confirm the proper growth and development of the baby.  Fetal screens for spinal cord problems (spina bifida) and Down syndrome.  HIV (human immunodeficiency virus) testing. Routine prenatal testing includes screening for HIV, unless you choose not to have this test.  You may need other tests to make sure you and the baby are doing well.  Follow these instructions at home: Medicines  Follow your health care provider's instructions regarding medicine use. Specific medicines may be either safe or unsafe to take during pregnancy.  Take a prenatal vitamin that contains at least 600 micrograms (mcg) of folic acid.  If you develop constipation, try taking a stool softener if your health care provider approves. Eating and drinking  Eat a balanced diet that includes fresh fruits and vegetables, whole grains, good sources of protein such as meat, eggs, or tofu, and low-fat dairy. Your health care provider will help you determine the amount of weight gain that is right for you.  Avoid raw meat and uncooked cheese. These carry germs that can cause birth defects in the baby.  Eating four or five small meals rather than three large meals a day may help relieve nausea and vomiting. If you start to feel nauseous, eating a few soda crackers can be helpful. Drinking liquids between meals, instead of during meals, also seems to help ease nausea and vomiting.  Limit foods that are high in fat and processed sugars, such as fried and sweet foods.  To prevent constipation: ? Eat foods that are high in fiber, such as fresh fruits and vegetables, whole grains, and beans. ? Drink enough fluid to keep your urine clear or pale yellow. Activity  Exercise only as directed by your health care provider. Most women can continue their usual exercise routine during  pregnancy. Try to exercise for 30 minutes at least 5 days a week. Exercising will help you: ? Control your weight. ? Stay in shape. ? Be prepared for labor and delivery.  Experiencing pain or cramping in the lower abdomen or lower back is a good sign that you should stop exercising. Check with your health care provider before continuing with normal exercises.  Try to avoid standing for long periods of time. Move your legs often if you must stand in one place for a long time.  Avoid heavy lifting.  Wear low-heeled shoes and practice good posture.  You may continue to have sex unless your health care provider tells you not to. Relieving pain and discomfort  Wear a good support bra to relieve breast tenderness.  Take warm sitz baths to soothe any pain or discomfort caused by hemorrhoids. Use hemorrhoid cream if your health care provider approves.  Rest with your legs elevated if you have leg cramps or low back pain.  If you   develop varicose veins in your legs, wear support hose. Elevate your feet for 15 minutes, 3-4 times a day. Limit salt in your diet. Prenatal care  Schedule your prenatal visits by the twelfth week of pregnancy. They are usually scheduled monthly at first, then more often in the last 2 months before delivery.  Write down your questions. Take them to your prenatal visits.  Keep all your prenatal visits as told by your health care provider. This is important. Safety  Wear your seat belt at all times when driving.  Make a list of emergency phone numbers, including numbers for family, friends, the hospital, and police and fire departments. General instructions  Ask your health care provider for a referral to a local prenatal education class. Begin classes no later than the beginning of month 6 of your pregnancy.  Ask for help if you have counseling or nutritional needs during pregnancy. Your health care provider can offer advice or refer you to specialists for help  with various needs.  Do not use hot tubs, steam rooms, or saunas.  Do not douche or use tampons or scented sanitary pads.  Do not cross your legs for long periods of time.  Avoid cat litter boxes and soil used by cats. These carry germs that can cause birth defects in the baby and possibly loss of the fetus by miscarriage or stillbirth.  Avoid all smoking, herbs, alcohol, and medicines not prescribed by your health care provider. Chemicals in these products affect the formation and growth of the baby.  Do not use any products that contain nicotine or tobacco, such as cigarettes and e-cigarettes. If you need help quitting, ask your health care provider. You may receive counseling support and other resources to help you quit.  Schedule a dentist appointment. At home, brush your teeth with a soft toothbrush and be gentle when you floss. Contact a health care provider if:  You have dizziness.  You have mild pelvic cramps, pelvic pressure, or nagging pain in the abdominal area.  You have persistent nausea, vomiting, or diarrhea.  You have a bad smelling vaginal discharge.  You have pain when you urinate.  You notice increased swelling in your face, hands, legs, or ankles.  You are exposed to fifth disease or chickenpox.  You are exposed to German measles (rubella) and have never had it. Get help right away if:  You have a fever.  You are leaking fluid from your vagina.  You have spotting or bleeding from your vagina.  You have severe abdominal cramping or pain.  You have rapid weight gain or loss.  You vomit blood or material that looks like coffee grounds.  You develop a severe headache.  You have shortness of breath.  You have any kind of trauma, such as from a fall or a car accident. Summary  The first trimester of pregnancy is from week 1 until the end of week 13 (months 1 through 3).  Your body goes through many changes during pregnancy. The changes vary from  woman to woman.  You will have routine prenatal visits. During those visits, your health care provider will examine you, discuss any test results you may have, and talk with you about how you are feeling. This information is not intended to replace advice given to you by your health care provider. Make sure you discuss any questions you have with your health care provider. Document Released: 06/04/2001 Document Revised: 05/22/2016 Document Reviewed: 05/22/2016 [employer] Interactive Patient Education  2017   [employer]   Breastfeeding Deciding to breastfeed is one of the best choices you can make for you and your baby. A change in hormones during pregnancy causes your breast tissue to grow and increases the number and size of your milk ducts. These hormones also allow proteins, sugars, and fats from your blood supply to make breast milk in your milk-producing glands. Hormones prevent breast milk from being released before your baby is born as well as prompt milk flow after birth. Once breastfeeding has begun, thoughts of your baby, as well as his or her sucking or crying, can stimulate the release of milk from your milk-producing glands. Benefits of breastfeeding For Your Baby  Your first milk (colostrum) helps your baby's digestive system function better.  There are antibodies in your milk that help your baby fight off infections.  Your baby has a lower incidence of asthma, allergies, and sudden infant death syndrome.  The nutrients in breast milk are better for your baby than infant formulas and are designed uniquely for your baby's needs.  Breast milk improves your baby's brain development.  Your baby is less likely to develop other conditions, such as childhood obesity, asthma, or type 2 diabetes mellitus.  For You  Breastfeeding helps to create a very special bond between you and your baby.  Breastfeeding is convenient. Breast milk is always available at the correct temperature and  costs nothing.  Breastfeeding helps to burn calories and helps you lose the weight gained during pregnancy.  Breastfeeding makes your uterus contract to its prepregnancy size faster and slows bleeding (lochia) after you give birth.  Breastfeeding helps to lower your risk of developing type 2 diabetes mellitus, osteoporosis, and breast or ovarian cancer later in life.  Signs that your baby is hungry Early Signs of Hunger  Increased alertness or activity.  Stretching.  Movement of the head from side to side.  Movement of the head and opening of the mouth when the corner of the mouth or cheek is stroked (rooting).  Increased sucking sounds, smacking lips, cooing, sighing, or squeaking.  Hand-to-mouth movements.  Increased sucking of fingers or hands.  Late Signs of Hunger  Fussing.  Intermittent crying.  Extreme Signs of Hunger Signs of extreme hunger will require calming and consoling before your baby will be able to breastfeed successfully. Do not wait for the following signs of extreme hunger to occur before you initiate breastfeeding:  Restlessness.  A loud, strong cry.  Screaming.  Breastfeeding basics Breastfeeding Initiation  Find a comfortable place to sit or lie down, with your neck and back well supported.  Place a pillow or rolled up blanket under your baby to bring him or her to the level of your breast (if you are seated). Nursing pillows are specially designed to help support your arms and your baby while you breastfeed.  Make sure that your baby's abdomen is facing your abdomen.  Gently massage your breast. With your fingertips, massage from your chest wall toward your nipple in a circular motion. This encourages milk flow. You may need to continue this action during the feeding if your milk flows slowly.  Support your breast with 4 fingers underneath and your thumb above your nipple. Make sure your fingers are well away from your nipple and your baby's  mouth.  Stroke your baby's lips gently with your finger or nipple.  When your baby's mouth is open wide enough, quickly bring your baby to your breast, placing your entire nipple and as   much of the colored area around your nipple (areola) as possible into your baby's mouth. ? More areola should be visible above your baby's upper lip than below the lower lip. ? Your baby's tongue should be between his or her lower gum and your breast.  Ensure that your baby's mouth is correctly positioned around your nipple (latched). Your baby's lips should create a seal on your breast and be turned out (everted).  It is common for your baby to suck about 2-3 minutes in order to start the flow of breast milk.  Latching Teaching your baby how to latch on to your breast properly is very important. An improper latch can cause nipple pain and decreased milk supply for you and poor weight gain in your baby. Also, if your baby is not latched onto your nipple properly, he or she may swallow some air during feeding. This can make your baby fussy. Burping your baby when you switch breasts during the feeding can help to get rid of the air. However, teaching your baby to latch on properly is still the best way to prevent fussiness from swallowing air while breastfeeding. Signs that your baby has successfully latched on to your nipple:  Silent tugging or silent sucking, without causing you pain.  Swallowing heard between every 3-4 sucks.  Muscle movement above and in front of his or her ears while sucking.  Signs that your baby has not successfully latched on to nipple:  Sucking sounds or smacking sounds from your baby while breastfeeding.  Nipple pain.  If you think your baby has not latched on correctly, slip your finger into the corner of your baby's mouth to break the suction and place it between your baby's gums. Attempt breastfeeding initiation again. Signs of Successful Breastfeeding Signs from your  baby:  A gradual decrease in the number of sucks or complete cessation of sucking.  Falling asleep.  Relaxation of his or her body.  Retention of a small amount of milk in his or her mouth.  Letting go of your breast by himself or herself.  Signs from you:  Breasts that have increased in firmness, weight, and size 1-3 hours after feeding.  Breasts that are softer immediately after breastfeeding.  Increased milk volume, as well as a change in milk consistency and color by the fifth day of breastfeeding.  Nipples that are not sore, cracked, or bleeding.  Signs That Your Baby is Getting Enough Milk  Wetting at least 1-2 diapers during the first 24 hours after birth.  Wetting at least 5-6 diapers every 24 hours for the first week after birth. The urine should be clear or pale yellow by 5 days after birth.  Wetting 6-8 diapers every 24 hours as your baby continues to grow and develop.  At least 3 stools in a 24-hour period by age 5 days. The stool should be soft and yellow.  At least 3 stools in a 24-hour period by age 7 days. The stool should be seedy and yellow.  No loss of weight greater than 10% of birth weight during the first 3 days of age.  Average weight gain of 4-7 ounces (113-198 g) per week after age 4 days.  Consistent daily weight gain by age 5 days, without weight loss after the age of 2 weeks.  After a feeding, your baby may spit up a small amount. This is common. Breastfeeding frequency and duration Frequent feeding will help you make more milk and can prevent sore nipples and   breast engorgement. Breastfeed when you feel the need to reduce the fullness of your breasts or when your baby shows signs of hunger. This is called "breastfeeding on demand." Avoid introducing a pacifier to your baby while you are working to establish breastfeeding (the first 4-6 weeks after your baby is born). After this time you may choose to use a pacifier. Research has shown that  pacifier use during the first year of a baby's life decreases the risk of sudden infant death syndrome (SIDS). Allow your baby to feed on each breast as long as he or she wants. Breastfeed until your baby is finished feeding. When your baby unlatches or falls asleep while feeding from the first breast, offer the second breast. Because newborns are often sleepy in the first few weeks of life, you may need to awaken your baby to get him or her to feed. Breastfeeding times will vary from baby to baby. However, the following rules can serve as a guide to help you ensure that your baby is properly fed:  Newborns (babies 4 weeks of age or younger) may breastfeed every 1-3 hours.  Newborns should not go longer than 3 hours during the day or 5 hours during the night without breastfeeding.  You should breastfeed your baby a minimum of 8 times in a 24-hour period until you begin to introduce solid foods to your baby at around 6 months of age.  Breast milk pumping Pumping and storing breast milk allows you to ensure that your baby is exclusively fed your breast milk, even at times when you are unable to breastfeed. This is especially important if you are going back to work while you are still breastfeeding or when you are not able to be present during feedings. Your lactation consultant can give you guidelines on how long it is safe to store breast milk. A breast pump is a machine that allows you to pump milk from your breast into a sterile bottle. The pumped breast milk can then be stored in a refrigerator or freezer. Some breast pumps are operated by hand, while others use electricity. Ask your lactation consultant which type will work best for you. Breast pumps can be purchased, but some hospitals and breastfeeding support groups lease breast pumps on a monthly basis. A lactation consultant can teach you how to hand express breast milk, if you prefer not to use a pump. Caring for your breasts while you  breastfeed Nipples can become dry, cracked, and sore while breastfeeding. The following recommendations can help keep your breasts moisturized and healthy:  Avoid using soap on your nipples.  Wear a supportive bra. Although not required, special nursing bras and tank tops are designed to allow access to your breasts for breastfeeding without taking off your entire bra or top. Avoid wearing underwire-style bras or extremely tight bras.  Air dry your nipples for 3-4minutes after each feeding.  Use only cotton bra pads to absorb leaked breast milk. Leaking of breast milk between feedings is normal.  Use lanolin on your nipples after breastfeeding. Lanolin helps to maintain your skin's normal moisture barrier. If you use pure lanolin, you do not need to wash it off before feeding your baby again. Pure lanolin is not toxic to your baby. You may also hand express a few drops of breast milk and gently massage that milk into your nipples and allow the milk to air dry.  In the first few weeks after giving birth, some women experience extremely full breasts (engorgement).   Engorgement can make your breasts feel heavy, warm, and tender to the touch. Engorgement peaks within 3-5 days after you give birth. The following recommendations can help ease engorgement:  Completely empty your breasts while breastfeeding or pumping. You may want to start by applying warm, moist heat (in the shower or with warm water-soaked hand towels) just before feeding or pumping. This increases circulation and helps the milk flow. If your baby does not completely empty your breasts while breastfeeding, pump any extra milk after he or she is finished.  Wear a snug bra (nursing or regular) or tank top for 1-2 days to signal your body to slightly decrease milk production.  Apply ice packs to your breasts, unless this is too uncomfortable for you.  Make sure that your baby is latched on and positioned properly while  breastfeeding.  If engorgement persists after 48 hours of following these recommendations, contact your health care provider or a lactation consultant. Overall health care recommendations while breastfeeding  Eat healthy foods. Alternate between meals and snacks, eating 3 of each per day. Because what you eat affects your breast milk, some of the foods may make your baby more irritable than usual. Avoid eating these foods if you are sure that they are negatively affecting your baby.  Drink milk, fruit juice, and water to satisfy your thirst (about 10 glasses a day).  Rest often, relax, and continue to take your prenatal vitamins to prevent fatigue, stress, and anemia.  Continue breast self-awareness checks.  Avoid chewing and smoking tobacco. Chemicals from cigarettes that pass into breast milk and exposure to secondhand smoke may harm your baby.  Avoid alcohol and drug use, including marijuana. Some medicines that may be harmful to your baby can pass through breast milk. It is important to ask your health care provider before taking any medicine, including all over-the-counter and prescription medicine as well as vitamin and herbal supplements. It is possible to become pregnant while breastfeeding. If birth control is desired, ask your health care provider about options that will be safe for your baby. Contact a health care provider if:  You feel like you want to stop breastfeeding or have become frustrated with breastfeeding.  You have painful breasts or nipples.  Your nipples are cracked or bleeding.  Your breasts are red, tender, or warm.  You have a swollen area on either breast.  You have a fever or chills.  You have nausea or vomiting.  You have drainage other than breast milk from your nipples.  Your breasts do not become full before feedings by the fifth day after you give birth.  You feel sad and depressed.  Your baby is too sleepy to eat well.  Your baby is having  trouble sleeping.  Your baby is wetting less than 3 diapers in a 24-hour period.  Your baby has less than 3 stools in a 24-hour period.  Your baby's skin or the white part of his or her eyes becomes yellow.  Your baby is not gaining weight by 5 days of age. Get help right away if:  Your baby is overly tired (lethargic) and does not want to wake up and feed.  Your baby develops an unexplained fever. This information is not intended to replace advice given to you by your health care provider. Make sure you discuss any questions you have with your health care provider. Document Released: 06/10/2005 Document Revised: 11/22/2015 Document Reviewed: 12/02/2012 [employer] Interactive Patient Education  2017 [employer]  

## 2016-12-02 NOTE — Progress Notes (Signed)
Pt informed that the ultrasound is considered a limited OB ultrasound and is not intended to be a complete ultrasound exam.  Patient also informed that the ultrasound is not being completed with the intent of assessing for fetal or placental anomalies or any pelvic abnormalities.  Explained that the purpose of today's ultrasound is to assess for viability.  Patient acknowledges the purpose of the exam and the limitations of the study.    Single IUP FHR - 148 per M-mode Dr. Shawnie PonsPratt notified

## 2016-12-02 NOTE — Progress Notes (Signed)
   Subjective:    Cynthia Schultz is a [id number] 2255w2d being seen today for her first obstetrical visit.  Her obstetrical history is significant for smoker and hypertension. Patient does intend to breast feed. Pregnancy history fully reviewed.  Patient reports no complaints.  Vitals:   12/02/16 0922  BP: 140/85  Pulse: 81  Weight: 210 lb 14.4 oz (95.7 kg)    HISTORY: OB History  Gravida Para Term Preterm AB Living  5 1 1   3 1   SAB TAB Ectopic Multiple Live Births  2   1   1     # Outcome Date GA Lbr Len/2nd Weight Sex Delivery Anes PTL Lv  5 Current           4 SAB 01/2014          3 SAB 01/2011          2 Ectopic 07/2010          1 Term 03/2009 3663w3d  6 lb (2.722 kg) M CS-LTranv EPI  LIV     Complications: Fetal Intolerance     Birth Comments: System Generated. Please review and update pregnancy details.     Past Medical History:  Diagnosis Date  . Complication of anesthesia   . Hypertension    Past Surgical History:  Procedure Laterality Date  . CESAREAN SECTION     Family History  Problem Relation Age of Onset  . Diabetes Mother   . Cancer Mother        cervix  . Cancer Maternal Grandmother        cervical     Exam    Uterus:   8 wk size  Pelvic Exam:    Perineum: Normal Perineum   Vulva: normal   Vagina:  normal discharge   Cervix: multiparous appearance   Adnexa: normal adnexa   Bony Pelvis: average  System: Breast:  normal appearance, no masses or tenderness   Skin: normal coloration and turgor, no rashes    Neurologic: oriented   Extremities: normal strength, tone, and muscle mass   HEENT extra ocular movement intact and sclera clear, anicteric   Mouth/Teeth mucous membranes moist, pharynx normal without lesions   Neck supple   Cardiovascular: regular rate and rhythm   Respiratory:  appears well, vitals normal, no respiratory distress, acyanotic, normal RR, ear and throat exam is normal, neck free of mass or lymphadenopathy, chest clear, no  wheezing, crepitations, rhonchi, normal symmetric air entry   Abdomen: soft, non-tender; bowel sounds normal; no masses,  no organomegaly   Abd u/s reveals SIUP + flicker   Assessment/Plan:   1. Supervision of high risk pregnancy, antepartum New OB labs - Obstetric Panel, Including HIV - Hemoglobin A1c - Culture, OB Urine - Cystic Fibrosis Mutation 97 - US MFM Fetal Nuchal Translucency; Future - Cytology - PAP  2. Previous cesarean delivery affecting pregnancy, antepartum Desires TOLAC - US MFM Fetal Nuchal Translucency; Future - Cytology - PAP  3. Chronic hypertension during pregnancy, antepartum Does not need meds at this time. Will need Baby ASA after 12 wks - US MFM Fetal Nuchal Translucency; Future - Cytology - PAP  4. Essential hypertension, benign Baseline labs meds if BP increases > 150/100 - Comprehensive metabolic panel - Protein / creatinine ratio, urine - TSH - US MFM Fetal Nuchal Translucency; Future  5. Encounter to determine fetal viability of pregnancy, single or unspecified fetus - US OB Limited   Reva Boresanya S Adara Kittle 12/02/2016

## 2016-12-04 ENCOUNTER — Encounter: Payer: Self-pay | Admitting: Obstetrics & Gynecology

## 2016-12-04 ENCOUNTER — Telehealth: Payer: Self-pay | Admitting: *Deleted

## 2016-12-04 DIAGNOSIS — O23591 Infection of other part of genital tract in pregnancy, first trimester: Secondary | ICD-10-CM

## 2016-12-04 DIAGNOSIS — A5901 Trichomonal vulvovaginitis: Secondary | ICD-10-CM | POA: Insufficient documentation

## 2016-12-04 LAB — CYTOLOGY - PAP
Diagnosis: NEGATIVE
HPV (WINDOPATH): NOT DETECTED

## 2016-12-04 MED ORDER — TERCONAZOLE 0.4 % VA CREA: 1.0000 | TOPICAL_CREAM | Freq: Every day | VAGINAL | 0 refills | Status: DC

## 2016-12-04 MED ORDER — METRONIDAZOLE 500 MG PO TABS: 500.0000 mg | ORAL_TABLET | Freq: Two times a day (BID) | ORAL | 0 refills | Status: DC

## 2016-12-04 NOTE — Telephone Encounter (Addendum)
-----   Message from Tereso NewcomerUgonna A Anyanwu, MD sent at 12/04/2016  1:19 PM EDT ----- Patient has Trichomonas vaginitis.  She needs to let partner(s) know so the partner(s) can get testing and treatment. Patient and sex partner(s) should abstain from unprotected sexual activity for seven days after everyone receives appropriate treatment.  Metronidazole was prescribed for patient. Patient also has yeast vaginitis, Terazol was also prescribed.  Please call to inform patient of results and recommendations, and advise to pick up prescriptions.  If patient's medications are expensive, [facility]  Will follow up up on the rest of her prenatal labs for other STI screen results.   1550  Called pt and informed her of test results and treatment indicated.  Pt was also advised of treatment needed for her partner(s) and to abstain from sex until 7 days after treatment completed for all parties. Pt voiced understanding and had no questions.

## 2016-12-04 NOTE — Addendum Note (Signed)
Addended by: Jaynie CollinsANYANWU, Glennice Marcos A on: 12/04/2016 01:19 PM   Modules accepted: Orders

## 2016-12-05 LAB — CULTURE, OB URINE

## 2016-12-05 LAB — URINE CULTURE, OB REFLEX

## 2016-12-09 LAB — OBSTETRIC PANEL, INCLUDING HIV
ANTIBODY SCREEN: NEGATIVE
BASOS: 0 %
Basophils Absolute: 0 10*3/uL (ref 0.0–0.2)
EOS (ABSOLUTE): 0.2 10*3/uL (ref 0.0–0.4)
EOS: 2 %
HEMATOCRIT: 39.7 % (ref 34.0–46.6)
HEMOGLOBIN: 12.9 g/dL (ref 11.1–15.9)
HIV Screen 4th Generation wRfx: NONREACTIVE
Hepatitis B Surface Ag: NEGATIVE
Immature Grans (Abs): 0 10*3/uL (ref 0.0–0.1)
Immature Granulocytes: 0 %
LYMPHS: 18 %
Lymphocytes Absolute: 2.4 10*3/uL (ref 0.7–3.1)
MCH: 32.3 pg (ref 26.6–33.0)
MCHC: 32.5 g/dL (ref 31.5–35.7)
MCV: 100 fL — AB (ref 79–97)
MONOCYTES: 5 %
Monocytes Absolute: 0.6 10*3/uL (ref 0.1–0.9)
Neutrophils Absolute: 9.6 10*3/uL — ABNORMAL HIGH (ref 1.4–7.0)
Neutrophils: 75 %
Platelets: 294 10*3/uL (ref 150–379)
RBC: 3.99 x10E6/uL (ref 3.77–5.28)
RDW: 14 % (ref 12.3–15.4)
RH TYPE: POSITIVE
RPR Ser Ql: NONREACTIVE
RUBELLA: 3.09 {index} (ref >0.99)
WBC: 12.9 10*3/uL — ABNORMAL HIGH (ref 3.4–10.8)

## 2016-12-09 LAB — COMPREHENSIVE METABOLIC PANEL
ALBUMIN: 4.4 g/dL (ref 3.5–5.5)
ALT: 12 IU/L (ref 0–32)
AST: 14 IU/L (ref 0–40)
Albumin/Globulin Ratio: 1.4 (ref 1.2–2.2)
Alkaline Phosphatase: 53 IU/L (ref 39–117)
BUN/Creatinine Ratio: 13 (ref 9–23)
BUN: 8 mg/dL (ref 6–20)
Bilirubin Total: 0.4 mg/dL (ref 0.0–1.2)
CALCIUM: 9.7 mg/dL (ref 8.7–10.2)
CO2: 23 mmol/L (ref 20–29)
CREATININE: 0.61 mg/dL (ref 0.57–1.00)
Chloride: 99 mmol/L (ref 96–106)
GFR, EST AFRICAN AMERICAN: 138 mL/min/{1.73_m2} (ref >59)
GFR, EST NON AFRICAN AMERICAN: 119 mL/min/{1.73_m2} (ref >59)
GLOBULIN, TOTAL: 3.2 g/dL (ref 1.5–4.5)
Glucose: 81 mg/dL (ref 65–99)
Potassium: 4.3 mmol/L (ref 3.5–5.2)
SODIUM: 135 mmol/L (ref 134–144)
TOTAL PROTEIN: 7.6 g/dL (ref 6.0–8.5)

## 2016-12-09 LAB — TSH: TSH: 0.58 u[IU]/mL (ref 0.450–4.500)

## 2016-12-09 LAB — PROTEIN / CREATININE RATIO, URINE

## 2016-12-09 LAB — CYSTIC FIBROSIS MUTATION 97: Interpretation: NOT DETECTED

## 2016-12-09 LAB — HEMOGLOBIN A1C
Est. average glucose Bld gHb Est-mCnc: 94 mg/dL
HEMOGLOBIN A1C: 4.9 % (ref 4.8–5.6)

## 2016-12-30 ENCOUNTER — Encounter: Payer: Self-pay | Admitting: Obstetrics & Gynecology

## 2016-12-31 ENCOUNTER — Other Ambulatory Visit: Payer: Self-pay | Admitting: Family Medicine

## 2016-12-31 ENCOUNTER — Ambulatory Visit (HOSPITAL_COMMUNITY)
Admission: RE | Admit: 2016-12-31 | Discharge: 2016-12-31 | Disposition: A | Discharge status: Home / Self Care | Payer: Medicaid Other | Source: Ambulatory Visit | Attending: Family Medicine | Admitting: Family Medicine

## 2016-12-31 ENCOUNTER — Other Ambulatory Visit (HOSPITAL_COMMUNITY): Payer: Self-pay | Admitting: *Deleted

## 2016-12-31 ENCOUNTER — Encounter (HOSPITAL_COMMUNITY): Payer: Self-pay

## 2016-12-31 ENCOUNTER — Ambulatory Visit (HOSPITAL_COMMUNITY): Admission: RE | Admit: 2016-12-31 | Payer: Medicaid Other | Source: Ambulatory Visit

## 2016-12-31 DIAGNOSIS — Z3A11 11 weeks gestation of pregnancy: Secondary | ICD-10-CM | POA: Diagnosis not present

## 2016-12-31 DIAGNOSIS — Z3682 Encounter for antenatal screening for nuchal translucency: Secondary | ICD-10-CM | POA: Insufficient documentation

## 2016-12-31 DIAGNOSIS — A5901 Trichomonal vulvovaginitis: Secondary | ICD-10-CM

## 2016-12-31 DIAGNOSIS — Z3A12 12 weeks gestation of pregnancy: Secondary | ICD-10-CM

## 2016-12-31 DIAGNOSIS — O10919 Unspecified pre-existing hypertension complicating pregnancy, unspecified trimester: Secondary | ICD-10-CM | POA: Diagnosis present

## 2016-12-31 DIAGNOSIS — O34219 Maternal care for unspecified type scar from previous cesarean delivery: Secondary | ICD-10-CM | POA: Diagnosis present

## 2016-12-31 DIAGNOSIS — O0990 Supervision of high risk pregnancy, unspecified, unspecified trimester: Secondary | ICD-10-CM

## 2016-12-31 DIAGNOSIS — O23591 Infection of other part of genital tract in pregnancy, first trimester: Secondary | ICD-10-CM

## 2016-12-31 DIAGNOSIS — I10 Essential (primary) hypertension: Secondary | ICD-10-CM

## 2016-12-31 HISTORY — DX: Trichomoniasis, unspecified: A59.9

## 2017-01-02 ENCOUNTER — Ambulatory Visit (INDEPENDENT_AMBULATORY_CARE_PROVIDER_SITE_OTHER): Payer: Medicaid Other | Admitting: Obstetrics and Gynecology

## 2017-01-02 ENCOUNTER — Other Ambulatory Visit (HOSPITAL_COMMUNITY)
Admission: RE | Admit: 2017-01-02 | Discharge: 2017-01-02 | Disposition: A | Discharge status: Home / Self Care | Payer: Medicaid Other | Source: Ambulatory Visit | Attending: Obstetrics & Gynecology | Admitting: Obstetrics & Gynecology

## 2017-01-02 VITALS — BP 146/89 | HR 93 | Wt 215.5 lb

## 2017-01-02 DIAGNOSIS — Z202 Contact with and (suspected) exposure to infections with a predominantly sexual mode of transmission: Secondary | ICD-10-CM

## 2017-01-02 DIAGNOSIS — A5901 Trichomonal vulvovaginitis: Secondary | ICD-10-CM

## 2017-01-02 DIAGNOSIS — O0990 Supervision of high risk pregnancy, unspecified, unspecified trimester: Secondary | ICD-10-CM

## 2017-01-02 DIAGNOSIS — O0991 Supervision of high risk pregnancy, unspecified, first trimester: Secondary | ICD-10-CM

## 2017-01-02 DIAGNOSIS — Z113 Encounter for screening for infections with a predominantly sexual mode of transmission: Secondary | ICD-10-CM | POA: Diagnosis not present

## 2017-01-02 DIAGNOSIS — O10911 Unspecified pre-existing hypertension complicating pregnancy, first trimester: Secondary | ICD-10-CM

## 2017-01-02 DIAGNOSIS — A749 Chlamydial infection, unspecified: Secondary | ICD-10-CM | POA: Diagnosis not present

## 2017-01-02 DIAGNOSIS — O10919 Unspecified pre-existing hypertension complicating pregnancy, unspecified trimester: Secondary | ICD-10-CM

## 2017-01-02 DIAGNOSIS — O23591 Infection of other part of genital tract in pregnancy, first trimester: Secondary | ICD-10-CM

## 2017-01-02 DIAGNOSIS — O34219 Maternal care for unspecified type scar from previous cesarean delivery: Secondary | ICD-10-CM

## 2017-01-02 LAB — POCT URINALYSIS DIP (DEVICE)
BILIRUBIN URINE: NEGATIVE
GLUCOSE, UA: NEGATIVE mg/dL
HGB URINE DIPSTICK: NEGATIVE
KETONES UR: NEGATIVE mg/dL
Nitrite: NEGATIVE
Protein, ur: NEGATIVE mg/dL
SPECIFIC GRAVITY, URINE: 1.015 (ref 1.005–1.030)
Urobilinogen, UA: 0.2 mg/dL (ref 0.0–1.0)
pH: 7 (ref 5.0–8.0)

## 2017-01-02 MED ORDER — ASPIRIN EC 81 MG PO TBEC: 81.0000 mg | DELAYED_RELEASE_TABLET | Freq: Every day | ORAL | 3 refills | Status: DC

## 2017-01-02 NOTE — Progress Notes (Signed)
Pt given list of medications safe to take in pregnancy

## 2017-01-02 NOTE — Progress Notes (Signed)
Prenatal Visit Note Date: 01/02/2017 [facility]  Subjective:  Cynthia Schultz is a 31 y.o. G5P1031 at 5864w6d being seen today for ongoing prenatal care.  She is currently monitored for the following issues for this high-risk pregnancy and has Supervision of high risk pregnancy, antepartum; Previous cesarean delivery affecting pregnancy, antepartum; Chronic hypertension during pregnancy, antepartum; Essential hypertension, benign; and Trichomonal vaginitis during pregnancy in first trimester on her problem list.  Patient reports no complaints.   Contractions: Not present. Vag. Bleeding: None.   . Denies leaking of fluid.   The following portions of the patient's history were reviewed and updated as appropriate: allergies, current medications, past family history, past medical history, past social history, past surgical history and problem list. Problem list updated.  Objective:   Vitals:   01/02/17 0953 01/02/17 0957  BP: (!) 142/85 (!) 146/89  Pulse: 93   Weight: 215 lb 8 oz (97.8 kg)     Fetal Status: Fetal Heart Rate (bpm): 159         General:  Alert, oriented and cooperative. Patient is in no acute distress.  Skin: Skin is warm and dry. No rash noted.   Cardiovascular: Normal heart rate noted  Respiratory: Normal respiratory effort, no problems with respiration noted  Abdomen: Soft, gravid, appropriate for gestational age. Pain/Pressure: Absent     Pelvic:  Cervical exam deferred        Extremities: Normal range of motion.  Edema: None  Mental Status: Normal mood and affect. Normal behavior. Normal judgment and thought content.   Urinalysis: Urine Protein: Negative Urine Glucose: Negative  Assessment and Plan:  Pregnancy: G5P1031 at 4664w6d  1. STD exposure TOC today - Cervicovaginal ancillary only  2. Chronic hypertension during pregnancy, antepartum On no meds and only barely mild range BPs. Pt okay with starting baby asa in one week. Would  start meds if in the 150s SBP and/or 100s DBP  3. Trichomonal vaginitis during pregnancy in first trimester See above  4. Supervision of high risk pregnancy, antepartum Routine care. To try for rpt NT scan this week  5. Previous cesarean delivery affecting pregnancy, antepartum D/w pt re: delivery mode later in pregnancy.   Preterm labor symptoms and general obstetric precautions including but not limited to vaginal bleeding, contractions, leaking of fluid and fetal movement were reviewed in detail with the patient. Please refer to After Visit Summary for other counseling recommendations.  Return in about 3 weeks (around 01/23/2017).   [place] BingPickens, Kostantinos Tallman, MD

## 2017-01-06 ENCOUNTER — Encounter: Payer: Self-pay | Admitting: Obstetrics and Gynecology

## 2017-01-06 DIAGNOSIS — A749 Chlamydial infection, unspecified: Secondary | ICD-10-CM | POA: Insufficient documentation

## 2017-01-06 DIAGNOSIS — O98811 Other maternal infectious and parasitic diseases complicating pregnancy, first trimester: Secondary | ICD-10-CM

## 2017-01-06 LAB — CERVICOVAGINAL ANCILLARY ONLY
CHLAMYDIA, DNA PROBE: POSITIVE — AB
NEISSERIA GONORRHEA: NEGATIVE
Trichomonas: NEGATIVE

## 2017-01-07 ENCOUNTER — Telehealth: Payer: Self-pay | Admitting: Lab

## 2017-01-07 ENCOUNTER — Other Ambulatory Visit: Payer: Self-pay | Admitting: Lab

## 2017-01-07 DIAGNOSIS — A749 Chlamydial infection, unspecified: Secondary | ICD-10-CM

## 2017-01-07 MED ORDER — AZITHROMYCIN 250 MG PO TABS: 1000.0000 mg | ORAL_TABLET | Freq: Once | ORAL | 0 refills | Status: AC

## 2017-01-07 NOTE — Telephone Encounter (Signed)
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 Also told patient that I sent her a prescription to the pharmacy for Azithromycin how to take the medication and to sustain from sex.

## 2017-01-08 NOTE — Telephone Encounter (Signed)
Error chart.

## 2017-01-10 ENCOUNTER — Ambulatory Visit (HOSPITAL_COMMUNITY)
Admission: RE | Admit: 2017-01-10 | Discharge: 2017-01-10 | Disposition: A | Discharge status: Home / Self Care | Payer: Medicaid Other | Source: Ambulatory Visit | Attending: Family Medicine | Admitting: Family Medicine

## 2017-01-10 ENCOUNTER — Encounter (HOSPITAL_COMMUNITY): Payer: Self-pay

## 2017-01-10 ENCOUNTER — Other Ambulatory Visit (HOSPITAL_COMMUNITY): Payer: Self-pay | Admitting: Obstetrics and Gynecology

## 2017-01-10 DIAGNOSIS — A5901 Trichomonal vulvovaginitis: Secondary | ICD-10-CM

## 2017-01-10 DIAGNOSIS — Z3682 Encounter for antenatal screening for nuchal translucency: Secondary | ICD-10-CM

## 2017-01-10 DIAGNOSIS — O10011 Pre-existing essential hypertension complicating pregnancy, first trimester: Secondary | ICD-10-CM | POA: Insufficient documentation

## 2017-01-10 DIAGNOSIS — O34219 Maternal care for unspecified type scar from previous cesarean delivery: Secondary | ICD-10-CM

## 2017-01-10 DIAGNOSIS — Z3A13 13 weeks gestation of pregnancy: Secondary | ICD-10-CM | POA: Diagnosis not present

## 2017-01-10 DIAGNOSIS — O99211 Obesity complicating pregnancy, first trimester: Secondary | ICD-10-CM | POA: Diagnosis not present

## 2017-01-10 DIAGNOSIS — O34211 Maternal care for low transverse scar from previous cesarean delivery: Secondary | ICD-10-CM | POA: Insufficient documentation

## 2017-01-10 DIAGNOSIS — O23591 Infection of other part of genital tract in pregnancy, first trimester: Secondary | ICD-10-CM

## 2017-01-10 DIAGNOSIS — I10 Essential (primary) hypertension: Secondary | ICD-10-CM

## 2017-01-15 ENCOUNTER — Other Ambulatory Visit: Payer: Self-pay

## 2017-01-23 ENCOUNTER — Ambulatory Visit (INDEPENDENT_AMBULATORY_CARE_PROVIDER_SITE_OTHER): Payer: Medicaid Other | Admitting: Obstetrics and Gynecology

## 2017-01-23 ENCOUNTER — Encounter: Payer: Self-pay | Admitting: Obstetrics and Gynecology

## 2017-01-23 ENCOUNTER — Other Ambulatory Visit (HOSPITAL_COMMUNITY)
Admission: RE | Admit: 2017-01-23 | Discharge: 2017-01-23 | Disposition: A | Discharge status: Home / Self Care | Payer: Medicaid Other | Source: Ambulatory Visit | Attending: Obstetrics and Gynecology | Admitting: Obstetrics and Gynecology

## 2017-01-23 VITALS — BP 118/69 | HR 80 | Wt 214.4 lb

## 2017-01-23 DIAGNOSIS — O98812 Other maternal infectious and parasitic diseases complicating pregnancy, second trimester: Secondary | ICD-10-CM | POA: Diagnosis not present

## 2017-01-23 DIAGNOSIS — Z113 Encounter for screening for infections with a predominantly sexual mode of transmission: Secondary | ICD-10-CM

## 2017-01-23 DIAGNOSIS — O0992 Supervision of high risk pregnancy, unspecified, second trimester: Secondary | ICD-10-CM

## 2017-01-23 DIAGNOSIS — Z3A14 14 weeks gestation of pregnancy: Secondary | ICD-10-CM | POA: Diagnosis not present

## 2017-01-23 DIAGNOSIS — Z8619 Personal history of other infectious and parasitic diseases: Secondary | ICD-10-CM | POA: Insufficient documentation

## 2017-01-23 DIAGNOSIS — O10912 Unspecified pre-existing hypertension complicating pregnancy, second trimester: Secondary | ICD-10-CM | POA: Insufficient documentation

## 2017-01-23 DIAGNOSIS — O0990 Supervision of high risk pregnancy, unspecified, unspecified trimester: Secondary | ICD-10-CM

## 2017-01-23 DIAGNOSIS — O34219 Maternal care for unspecified type scar from previous cesarean delivery: Secondary | ICD-10-CM

## 2017-01-23 DIAGNOSIS — O98811 Other maternal infectious and parasitic diseases complicating pregnancy, first trimester: Secondary | ICD-10-CM

## 2017-01-23 DIAGNOSIS — A749 Chlamydial infection, unspecified: Secondary | ICD-10-CM

## 2017-01-23 DIAGNOSIS — O10919 Unspecified pre-existing hypertension complicating pregnancy, unspecified trimester: Secondary | ICD-10-CM

## 2017-01-23 LAB — POCT URINALYSIS DIP (DEVICE)
Bilirubin Urine: NEGATIVE
Glucose, UA: NEGATIVE mg/dL
HGB URINE DIPSTICK: NEGATIVE
Ketones, ur: NEGATIVE mg/dL
NITRITE: NEGATIVE
PH: 7 (ref 5.0–8.0)
Protein, ur: NEGATIVE mg/dL
Specific Gravity, Urine: 1.015 (ref 1.005–1.030)
UROBILINOGEN UA: 0.2 mg/dL (ref 0.0–1.0)

## 2017-01-23 NOTE — Progress Notes (Signed)
Subjective:  Cynthia Schultz is a 31 y.o. G5P1031 at 912w6d being seen today for ongoing prenatal care.  She is currently monitored for the following issues for this high-risk pregnancy and has Supervision of high risk pregnancy, antepartum; Previous cesarean delivery affecting pregnancy, antepartum; Chronic hypertension during pregnancy, antepartum; Essential hypertension, benign; and Chlamydia infection affecting pregnancy in first trimester, antepartum on her problem list.  Patient reports no complaints.  Contractions: Not present. Vag. Bleeding: None.  Movement: Absent. Denies leaking of fluid.   The following portions of the patient's history were reviewed and updated as appropriate: allergies, current medications, past family history, past medical history, past social history, past surgical history and problem list. Problem list updated.  Objective:   Vitals:   01/23/17 1153 01/23/17 1157  BP: (!) 110/91 118/69  Pulse: 78 80  Weight: 214 lb 6.4 oz (97.3 kg)     Fetal Status:     Movement: Absent     General:  Alert, oriented and cooperative. Patient is in no acute distress.  Skin: Skin is warm and dry. No rash noted.   Cardiovascular: Normal heart rate noted  Respiratory: Normal respiratory effort, no problems with respiration noted  Abdomen: Soft, gravid, appropriate for gestational age. Pain/Pressure: Absent     Pelvic:  Cervical exam deferred        Extremities: Normal range of motion.  Edema: None  Mental Status: Normal mood and affect. Normal behavior. Normal judgment and thought content.   Urinalysis:      Assessment and Plan:  Pregnancy: G5P1031 at 822w6d  1. Supervision of high risk pregnancy, antepartum Stable - US MFM OB DETAIL +14 WK; Future  2. Chronic hypertension during pregnancy, antepartum BP stable, no meds. Continue with BASA First BP slightly increased d/t not hearing FHT's intially - US MFM OB DETAIL +14 WK; Future  3. Chlamydia infection affecting  pregnancy in first trimester, antepartum TOC today  4. Previous cesarean delivery affecting pregnancy, antepartum Need to discuss TOLAC at later date  Preterm labor symptoms and general obstetric precautions including but not limited to vaginal bleeding, contractions, leaking of fluid and fetal movement were reviewed in detail with the patient. Please refer to After Visit Summary for other counseling recommendations.  Return in about 4 weeks (around 02/20/2017) for OB visit.   Hermina StaggersErvin, Michael L, MD

## 2017-01-23 NOTE — Addendum Note (Signed)
Addended byRaynald Blend: Qiara Minetti L on: 01/23/2017 01:00 PM   Modules accepted: Orders

## 2017-01-24 LAB — GC/CHLAMYDIA PROBE AMP (~~LOC~~) NOT AT ARMC
CHLAMYDIA, DNA PROBE: NEGATIVE
Neisseria Gonorrhea: NEGATIVE

## 2017-02-20 ENCOUNTER — Ambulatory Visit (INDEPENDENT_AMBULATORY_CARE_PROVIDER_SITE_OTHER): Payer: Medicaid Other | Admitting: Family Medicine

## 2017-02-20 ENCOUNTER — Encounter: Payer: Self-pay | Admitting: Obstetrics and Gynecology

## 2017-02-20 VITALS — BP 118/80 | HR 84 | Wt 219.0 lb

## 2017-02-20 DIAGNOSIS — O10919 Unspecified pre-existing hypertension complicating pregnancy, unspecified trimester: Secondary | ICD-10-CM

## 2017-02-20 DIAGNOSIS — O0992 Supervision of high risk pregnancy, unspecified, second trimester: Secondary | ICD-10-CM

## 2017-02-20 DIAGNOSIS — O0990 Supervision of high risk pregnancy, unspecified, unspecified trimester: Secondary | ICD-10-CM

## 2017-02-20 DIAGNOSIS — O10912 Unspecified pre-existing hypertension complicating pregnancy, second trimester: Secondary | ICD-10-CM

## 2017-02-20 DIAGNOSIS — O34219 Maternal care for unspecified type scar from previous cesarean delivery: Secondary | ICD-10-CM

## 2017-02-20 NOTE — Patient Instructions (Signed)
 Second Trimester of Pregnancy The second trimester is from week 14 through week 27 (months 4 through 6). The second trimester is often a time when you feel your best. Your body has adjusted to being pregnant, and you begin to feel better physically. Usually, morning sickness has lessened or quit completely, you may have more energy, and you may have an increase in appetite. The second trimester is also a time when the fetus is growing rapidly. At the end of the sixth month, the fetus is about 9 inches long and weighs about 1 pounds. You will likely begin to feel the baby move (quickening) between 16 and 20 weeks of pregnancy. Body changes during your second trimester Your body continues to go through many changes during your second trimester. The changes vary from woman to woman.  Your weight will continue to increase. You will notice your lower abdomen bulging out.  You may begin to get stretch marks on your hips, abdomen, and breasts.  You may develop headaches that can be relieved by medicines. The medicines should be approved by your health care provider.  You may urinate more often because the fetus is pressing on your bladder.  You may develop or continue to have heartburn as a result of your pregnancy.  You may develop constipation because certain hormones are causing the muscles that push waste through your intestines to slow down.  You may develop hemorrhoids or swollen, bulging veins (varicose veins).  You may have back pain. This is caused by: ? Weight gain. ? Pregnancy hormones that are relaxing the joints in your pelvis. ? A shift in weight and the muscles that support your balance.  Your breasts will continue to grow and they will continue to become tender.  Your gums may bleed and may be sensitive to brushing and flossing.  Dark spots or blotches (chloasma, mask of pregnancy) may develop on your face. This will likely fade after the baby is born.  A dark line from  your belly button to the pubic area (linea nigra) may appear. This will likely fade after the baby is born.  You may have changes in your hair. These can include thickening of your hair, rapid growth, and changes in texture. Some women also have hair loss during or after pregnancy, or hair that feels dry or thin. Your hair will most likely return to normal after your baby is born.  What to expect at prenatal visits During a routine prenatal visit:  You will be weighed to make sure you and the fetus are growing normally.  Your blood pressure will be taken.  Your abdomen will be measured to track your baby's growth.  The fetal heartbeat will be listened to.  Any test results from the previous visit will be discussed.  Your health care provider may ask you:  How you are feeling.  If you are feeling the baby move.  If you have had any abnormal symptoms, such as leaking fluid, bleeding, severe headaches, or abdominal cramping.  If you are using any tobacco products, including cigarettes, chewing tobacco, and electronic cigarettes.  If you have any questions.  Other tests that may be performed during your second trimester include:  Blood tests that check for: ? Low iron levels (anemia). ? High blood sugar that affects pregnant women (gestational diabetes) between 24 and 28 weeks. ? Rh antibodies. This is to check for a protein on red blood cells (Rh factor).  Urine tests to check for infections, diabetes,   or protein in the urine.  An ultrasound to confirm the proper growth and development of the baby.  An amniocentesis to check for possible genetic problems.  Fetal screens for spina bifida and Down syndrome.  HIV (human immunodeficiency virus) testing. Routine prenatal testing includes screening for HIV, unless you choose not to have this test.  Follow these instructions at home: Medicines  Follow your health care provider's instructions regarding medicine use. Specific  medicines may be either safe or unsafe to take during pregnancy.  Take a prenatal vitamin that contains at least 600 micrograms (mcg) of folic acid.  If you develop constipation, try taking a stool softener if your health care provider approves. Eating and drinking  Eat a balanced diet that includes fresh fruits and vegetables, whole grains, good sources of protein such as meat, eggs, or tofu, and low-fat dairy. Your health care provider will help you determine the amount of weight gain that is right for you.  Avoid raw meat and uncooked cheese. These carry germs that can cause birth defects in the baby.  If you have low calcium intake from food, talk to your health care provider about whether you should take a daily calcium supplement.  Limit foods that are high in fat and processed sugars, such as fried and sweet foods.  To prevent constipation: ? Drink enough fluid to keep your urine clear or pale yellow. ? Eat foods that are high in fiber, such as fresh fruits and vegetables, whole grains, and beans. Activity  Exercise only as directed by your health care provider. Most women can continue their usual exercise routine during pregnancy. Try to exercise for 30 minutes at least 5 days a week. Stop exercising if you experience uterine contractions.  Avoid heavy lifting, wear low heel shoes, and practice good posture.  A sexual relationship may be continued unless your health care provider directs you otherwise. Relieving pain and discomfort  Wear a good support bra to prevent discomfort from breast tenderness.  Take warm sitz baths to soothe any pain or discomfort caused by hemorrhoids. Use hemorrhoid cream if your health care provider approves.  Rest with your legs elevated if you have leg cramps or low back pain.  If you develop varicose veins, wear support hose. Elevate your feet for 15 minutes, 3-4 times a day. Limit salt in your diet. Prenatal Care  Write down your questions.  Take them to your prenatal visits.  Keep all your prenatal visits as told by your health care provider. This is important. Safety  Wear your seat belt at all times when driving.  Make a list of emergency phone numbers, including numbers for family, friends, the hospital, and police and fire departments. General instructions  Ask your health care provider for a referral to a local prenatal education class. Begin classes no later than the beginning of month 6 of your pregnancy.  Ask for help if you have counseling or nutritional needs during pregnancy. Your health care provider can offer advice or refer you to specialists for help with various needs.  Do not use hot tubs, steam rooms, or saunas.  Do not douche or use tampons or scented sanitary pads.  Do not cross your legs for long periods of time.  Avoid cat litter boxes and soil used by cats. These carry germs that can cause birth defects in the baby and possibly loss of the fetus by miscarriage or stillbirth.  Avoid all smoking, herbs, alcohol, and unprescribed drugs. Chemicals in these products   can affect the formation and growth of the baby.  Do not use any products that contain nicotine or tobacco, such as cigarettes and e-cigarettes. If you need help quitting, ask your health care provider.  Visit your dentist if you have not gone yet during your pregnancy. Use a soft toothbrush to brush your teeth and be gentle when you floss. Contact a health care provider if:  You have dizziness.  You have mild pelvic cramps, pelvic pressure, or nagging pain in the abdominal area.  You have persistent nausea, vomiting, or diarrhea.  You have a bad smelling vaginal discharge.  You have pain when you urinate. Get help right away if:  You have a fever.  You are leaking fluid from your vagina.  You have spotting or bleeding from your vagina.  You have severe abdominal cramping or pain.  You have rapid weight gain or weight  loss.  You have shortness of breath with chest pain.  You notice sudden or extreme swelling of your face, hands, ankles, feet, or legs.  You have not felt your baby move in over an hour.  You have severe headaches that do not go away when you take medicine.  You have vision changes. Summary  The second trimester is from week 14 through week 27 (months 4 through 6). It is also a time when the fetus is growing rapidly.  Your body goes through many changes during pregnancy. The changes vary from woman to woman.  Avoid all smoking, herbs, alcohol, and unprescribed drugs. These chemicals affect the formation and growth your baby.  Do not use any tobacco products, such as cigarettes, chewing tobacco, and e-cigarettes. If you need help quitting, ask your health care provider.  Contact your health care provider if you have any questions. Keep all prenatal visits as told by your health care provider. This is important. This information is not intended to replace advice given to you by your health care provider. Make sure you discuss any questions you have with your health care provider. Document Released: 06/04/2001 Document Revised: 11/16/2015 Document Reviewed: 08/11/2012 [employer] Interactive Patient Education  2017 [employer]   Breastfeeding Deciding to breastfeed is one of the best choices you can make for you and your baby. A change in hormones during pregnancy causes your breast tissue to grow and increases the number and size of your milk ducts. These hormones also allow proteins, sugars, and fats from your blood supply to make breast milk in your milk-producing glands. Hormones prevent breast milk from being released before your baby is born as well as prompt milk flow after birth. Once breastfeeding has begun, thoughts of your baby, as well as his or her sucking or crying, can stimulate the release of milk from your milk-producing glands. Benefits of breastfeeding For Your  Baby  Your first milk (colostrum) helps your baby's digestive system function better.  There are antibodies in your milk that help your baby fight off infections.  Your baby has a lower incidence of asthma, allergies, and sudden infant death syndrome.  The nutrients in breast milk are better for your baby than infant formulas and are designed uniquely for your baby's needs.  Breast milk improves your baby's brain development.  Your baby is less likely to develop other conditions, such as childhood obesity, asthma, or type 2 diabetes mellitus.  For You  Breastfeeding helps to create a very special bond between you and your baby.  Breastfeeding is convenient. Breast milk is always available at   the correct temperature and costs nothing.  Breastfeeding helps to burn calories and helps you lose the weight gained during pregnancy.  Breastfeeding makes your uterus contract to its prepregnancy size faster and slows bleeding (lochia) after you give birth.  Breastfeeding helps to lower your risk of developing type 2 diabetes mellitus, osteoporosis, and breast or ovarian cancer later in life.  Signs that your baby is hungry Early Signs of Hunger  Increased alertness or activity.  Stretching.  Movement of the head from side to side.  Movement of the head and opening of the mouth when the corner of the mouth or cheek is stroked (rooting).  Increased sucking sounds, smacking lips, cooing, sighing, or squeaking.  Hand-to-mouth movements.  Increased sucking of fingers or hands.  Late Signs of Hunger  Fussing.  Intermittent crying.  Extreme Signs of Hunger Signs of extreme hunger will require calming and consoling before your baby will be able to breastfeed successfully. Do not wait for the following signs of extreme hunger to occur before you initiate breastfeeding:  Restlessness.  A loud, strong cry.  Screaming.  Breastfeeding basics Breastfeeding Initiation  Find a  comfortable place to sit or lie down, with your neck and back well supported.  Place a pillow or rolled up blanket under your baby to bring him or her to the level of your breast (if you are seated). Nursing pillows are specially designed to help support your arms and your baby while you breastfeed.  Make sure that your baby's abdomen is facing your abdomen.  Gently massage your breast. With your fingertips, massage from your chest wall toward your nipple in a circular motion. This encourages milk flow. You may need to continue this action during the feeding if your milk flows slowly.  Support your breast with 4 fingers underneath and your thumb above your nipple. Make sure your fingers are well away from your nipple and your baby's mouth.  Stroke your baby's lips gently with your finger or nipple.  When your baby's mouth is open wide enough, quickly bring your baby to your breast, placing your entire nipple and as much of the colored area around your nipple (areola) as possible into your baby's mouth. ? More areola should be visible above your baby's upper lip than below the lower lip. ? Your baby's tongue should be between his or her lower gum and your breast.  Ensure that your baby's mouth is correctly positioned around your nipple (latched). Your baby's lips should create a seal on your breast and be turned out (everted).  It is common for your baby to suck about 2-3 minutes in order to start the flow of breast milk.  Latching Teaching your baby how to latch on to your breast properly is very important. An improper latch can cause nipple pain and decreased milk supply for you and poor weight gain in your baby. Also, if your baby is not latched onto your nipple properly, he or she may swallow some air during feeding. This can make your baby fussy. Burping your baby when you switch breasts during the feeding can help to get rid of the air. However, teaching your baby to latch on properly is  still the best way to prevent fussiness from swallowing air while breastfeeding. Signs that your baby has successfully latched on to your nipple:  Silent tugging or silent sucking, without causing you pain.  Swallowing heard between every 3-4 sucks.  Muscle movement above and in front of his or her   ears while sucking.  Signs that your baby has not successfully latched on to nipple:  Sucking sounds or smacking sounds from your baby while breastfeeding.  Nipple pain.  If you think your baby has not latched on correctly, slip your finger into the corner of your baby's mouth to break the suction and place it between your baby's gums. Attempt breastfeeding initiation again. Signs of Successful Breastfeeding Signs from your baby:  A gradual decrease in the number of sucks or complete cessation of sucking.  Falling asleep.  Relaxation of his or her body.  Retention of a small amount of milk in his or her mouth.  Letting go of your breast by himself or herself.  Signs from you:  Breasts that have increased in firmness, weight, and size 1-3 hours after feeding.  Breasts that are softer immediately after breastfeeding.  Increased milk volume, as well as a change in milk consistency and color by the fifth day of breastfeeding.  Nipples that are not sore, cracked, or bleeding.  Signs That Your Baby is Getting Enough Milk  Wetting at least 1-2 diapers during the first 24 hours after birth.  Wetting at least 5-6 diapers every 24 hours for the first week after birth. The urine should be clear or pale yellow by 5 days after birth.  Wetting 6-8 diapers every 24 hours as your baby continues to grow and develop.  At least 3 stools in a 24-hour period by age 5 days. The stool should be soft and yellow.  At least 3 stools in a 24-hour period by age 7 days. The stool should be seedy and yellow.  No loss of weight greater than 10% of birth weight during the first 3 days of age.  Average  weight gain of 4-7 ounces (113-198 g) per week after age 4 days.  Consistent daily weight gain by age 5 days, without weight loss after the age of 2 weeks.  After a feeding, your baby may spit up a small amount. This is common. Breastfeeding frequency and duration Frequent feeding will help you make more milk and can prevent sore nipples and breast engorgement. Breastfeed when you feel the need to reduce the fullness of your breasts or when your baby shows signs of hunger. This is called "breastfeeding on demand." Avoid introducing a pacifier to your baby while you are working to establish breastfeeding (the first 4-6 weeks after your baby is born). After this time you may choose to use a pacifier. Research has shown that pacifier use during the first year of a baby's life decreases the risk of sudden infant death syndrome (SIDS). Allow your baby to feed on each breast as long as he or she wants. Breastfeed until your baby is finished feeding. When your baby unlatches or falls asleep while feeding from the first breast, offer the second breast. Because newborns are often sleepy in the first few weeks of life, you may need to awaken your baby to get him or her to feed. Breastfeeding times will vary from baby to baby. However, the following rules can serve as a guide to help you ensure that your baby is properly fed:  Newborns (babies 4 weeks of age or younger) may breastfeed every 1-3 hours.  Newborns should not go longer than 3 hours during the day or 5 hours during the night without breastfeeding.  You should breastfeed your baby a minimum of 8 times in a 24-hour period until you begin to introduce solid foods to your   baby at around 6 months of age.  Breast milk pumping Pumping and storing breast milk allows you to ensure that your baby is exclusively fed your breast milk, even at times when you are unable to breastfeed. This is especially important if you are going back to work while you are still  breastfeeding or when you are not able to be present during feedings. Your lactation consultant can give you guidelines on how long it is safe to store breast milk. A breast pump is a machine that allows you to pump milk from your breast into a sterile bottle. The pumped breast milk can then be stored in a refrigerator or freezer. Some breast pumps are operated by hand, while others use electricity. Ask your lactation consultant which type will work best for you. Breast pumps can be purchased, but some hospitals and breastfeeding support groups lease breast pumps on a monthly basis. A lactation consultant can teach you how to hand express breast milk, if you prefer not to use a pump. Caring for your breasts while you breastfeed Nipples can become dry, cracked, and sore while breastfeeding. The following recommendations can help keep your breasts moisturized and healthy:  Avoid using soap on your nipples.  Wear a supportive bra. Although not required, special nursing bras and tank tops are designed to allow access to your breasts for breastfeeding without taking off your entire bra or top. Avoid wearing underwire-style bras or extremely tight bras.  Air dry your nipples for 3-4minutes after each feeding.  Use only cotton bra pads to absorb leaked breast milk. Leaking of breast milk between feedings is normal.  Use lanolin on your nipples after breastfeeding. Lanolin helps to maintain your skin's normal moisture barrier. If you use pure lanolin, you do not need to wash it off before feeding your baby again. Pure lanolin is not toxic to your baby. You may also hand express a few drops of breast milk and gently massage that milk into your nipples and allow the milk to air dry.  In the first few weeks after giving birth, some women experience extremely full breasts (engorgement). Engorgement can make your breasts feel heavy, warm, and tender to the touch. Engorgement peaks within 3-5 days after you give  birth. The following recommendations can help ease engorgement:  Completely empty your breasts while breastfeeding or pumping. You may want to start by applying warm, moist heat (in the shower or with warm water-soaked hand towels) just before feeding or pumping. This increases circulation and helps the milk flow. If your baby does not completely empty your breasts while breastfeeding, pump any extra milk after he or she is finished.  Wear a snug bra (nursing or regular) or tank top for 1-2 days to signal your body to slightly decrease milk production.  Apply ice packs to your breasts, unless this is too uncomfortable for you.  Make sure that your baby is latched on and positioned properly while breastfeeding.  If engorgement persists after 48 hours of following these recommendations, contact your health care provider or a lactation consultant. Overall health care recommendations while breastfeeding  Eat healthy foods. Alternate between meals and snacks, eating 3 of each per day. Because what you eat affects your breast milk, some of the foods may make your baby more irritable than usual. Avoid eating these foods if you are sure that they are negatively affecting your baby.  Drink milk, fruit juice, and water to satisfy your thirst (about 10 glasses a day).    Rest often, relax, and continue to take your prenatal vitamins to prevent fatigue, stress, and anemia.  Continue breast self-awareness checks.  Avoid chewing and smoking tobacco. Chemicals from cigarettes that pass into breast milk and exposure to secondhand smoke may harm your baby.  Avoid alcohol and drug use, including marijuana. Some medicines that may be harmful to your baby can pass through breast milk. It is important to ask your health care provider before taking any medicine, including all over-the-counter and prescription medicine as well as vitamin and herbal supplements. It is possible to become pregnant while breastfeeding.  If birth control is desired, ask your health care provider about options that will be safe for your baby. Contact a health care provider if:  You feel like you want to stop breastfeeding or have become frustrated with breastfeeding.  You have painful breasts or nipples.  Your nipples are cracked or bleeding.  Your breasts are red, tender, or warm.  You have a swollen area on either breast.  You have a fever or chills.  You have nausea or vomiting.  You have drainage other than breast milk from your nipples.  Your breasts do not become full before feedings by the fifth day after you give birth.  You feel sad and depressed.  Your baby is too sleepy to eat well.  Your baby is having trouble sleeping.  Your baby is wetting less than 3 diapers in a 24-hour period.  Your baby has less than 3 stools in a 24-hour period.  Your baby's skin or the white part of his or her eyes becomes yellow.  Your baby is not gaining weight by 5 days of age. Get help right away if:  Your baby is overly tired (lethargic) and does not want to wake up and feed.  Your baby develops an unexplained fever. This information is not intended to replace advice given to you by your health care provider. Make sure you discuss any questions you have with your health care provider. Document Released: 06/10/2005 Document Revised: 11/22/2015 Document Reviewed: 12/02/2012 [employer] Interactive Patient Education  2017 [employer]  

## 2017-02-20 NOTE — Progress Notes (Signed)
   PRENATAL VISIT NOTE  Subjective:  Cynthia Schultz is a 31 y.o. G5P1031 at 2328w6d being seen today for ongoing prenatal care.  She is currently monitored for the following issues for this high-risk pregnancy and has Supervision of high risk pregnancy, antepartum; Previous cesarean delivery affecting pregnancy, antepartum; Chronic hypertension during pregnancy, antepartum; Essential hypertension, benign; and Chlamydia infection affecting pregnancy in first trimester, antepartum on her problem list.  Patient reports no complaints.  Contractions: Not present. Vag. Bleeding: None.  Movement: Absent. Denies leaking of fluid.   The following portions of the patient's history were reviewed and updated as appropriate: allergies, current medications, past family history, past medical history, past social history, past surgical history and problem list. Problem list updated.  Objective:   Vitals:   02/20/17 0919  BP: 118/80  Pulse: 84  Weight: 219 lb (99.3 kg)    Fetal Status: Fetal Heart Rate (bpm): 150   Movement: Absent     General:  Alert, oriented and cooperative. Patient is in no acute distress.  Skin: Skin is warm and dry. No rash noted.   Cardiovascular: Normal heart rate noted  Respiratory: Normal respiratory effort, no problems with respiration noted  Abdomen: Soft, gravid, appropriate for gestational age.  Pain/Pressure: Absent     Pelvic: Cervical exam deferred        Extremities: Normal range of motion.  Edema: None  Mental Status:  Normal mood and affect. Normal behavior. Normal judgment and thought content.   Assessment and Plan:  Pregnancy: G5P1031 at 5028w6d  1. Chronic hypertension during pregnancy, antepartum BP is well controlled  2. Supervision of high risk pregnancy, antepartum Normal first screen  AFP only today Scheduled for anatomy u/s - AFP, Serum, Open Spina Bifida  3. Previous cesarean delivery affecting pregnancy, antepartum  General obstetric precautions  including but not limited to vaginal bleeding, contractions, leaking of fluid and fetal movement were reviewed in detail with the patient. Please refer to After Visit Summary for other counseling recommendations.  Return in 4 weeks (on 03/20/2017).   Cynthia Boresanya S Mikie Misner, MD

## 2017-02-23 LAB — AFP, SERUM, OPEN SPINA BIFIDA
AFP MoM: 1.07
AFP Value: 43.5 ng/mL
GEST. AGE ON COLLECTION DATE: 18.6 wk
MATERNAL AGE AT EDD: 33.6 a
OSBR Risk 1 IN: 10000
Test Results:: NEGATIVE
WEIGHT: 219 [lb_av]

## 2017-02-26 ENCOUNTER — Other Ambulatory Visit (HOSPITAL_COMMUNITY): Payer: Self-pay | Admitting: *Deleted

## 2017-02-26 ENCOUNTER — Ambulatory Visit (HOSPITAL_COMMUNITY)
Admission: RE | Admit: 2017-02-26 | Discharge: 2017-02-26 | Disposition: A | Discharge status: Home / Self Care | Payer: Medicaid Other | Source: Ambulatory Visit | Attending: Obstetrics and Gynecology | Admitting: Obstetrics and Gynecology

## 2017-02-26 ENCOUNTER — Encounter (HOSPITAL_COMMUNITY): Payer: Self-pay

## 2017-02-26 DIAGNOSIS — O34219 Maternal care for unspecified type scar from previous cesarean delivery: Secondary | ICD-10-CM | POA: Diagnosis not present

## 2017-02-26 DIAGNOSIS — O10912 Unspecified pre-existing hypertension complicating pregnancy, second trimester: Secondary | ICD-10-CM | POA: Insufficient documentation

## 2017-02-26 DIAGNOSIS — Z3A19 19 weeks gestation of pregnancy: Secondary | ICD-10-CM | POA: Insufficient documentation

## 2017-02-26 DIAGNOSIS — O0992 Supervision of high risk pregnancy, unspecified, second trimester: Secondary | ICD-10-CM | POA: Diagnosis present

## 2017-02-26 DIAGNOSIS — Z363 Encounter for antenatal screening for malformations: Secondary | ICD-10-CM | POA: Diagnosis present

## 2017-02-26 DIAGNOSIS — O10919 Unspecified pre-existing hypertension complicating pregnancy, unspecified trimester: Secondary | ICD-10-CM

## 2017-02-26 DIAGNOSIS — I10 Essential (primary) hypertension: Secondary | ICD-10-CM

## 2017-02-26 DIAGNOSIS — O0990 Supervision of high risk pregnancy, unspecified, unspecified trimester: Secondary | ICD-10-CM

## 2017-03-20 ENCOUNTER — Ambulatory Visit (INDEPENDENT_AMBULATORY_CARE_PROVIDER_SITE_OTHER): Payer: Medicaid Other | Admitting: Obstetrics and Gynecology

## 2017-03-20 VITALS — BP 122/78 | HR 101 | Wt 217.2 lb

## 2017-03-20 DIAGNOSIS — O0990 Supervision of high risk pregnancy, unspecified, unspecified trimester: Secondary | ICD-10-CM

## 2017-03-20 DIAGNOSIS — O0992 Supervision of high risk pregnancy, unspecified, second trimester: Secondary | ICD-10-CM

## 2017-03-20 DIAGNOSIS — Z3009 Encounter for other general counseling and advice on contraception: Secondary | ICD-10-CM

## 2017-03-20 DIAGNOSIS — O10912 Unspecified pre-existing hypertension complicating pregnancy, second trimester: Secondary | ICD-10-CM

## 2017-03-20 DIAGNOSIS — O10919 Unspecified pre-existing hypertension complicating pregnancy, unspecified trimester: Secondary | ICD-10-CM

## 2017-03-20 DIAGNOSIS — O34219 Maternal care for unspecified type scar from previous cesarean delivery: Secondary | ICD-10-CM

## 2017-03-20 NOTE — Progress Notes (Signed)
Subjective:  Cynthia Schultz is a 31 y.o. G5P1031 at [redacted]w[redacted]d being seen today for ongoing prenatal care.  She is currently monitored for the following issues for this high-risk pregnancy and has Supervision of high risk pregnancy, antepartum; Previous cesarean delivery affecting pregnancy, antepartum; Chronic hypertension during pregnancy, antepartum; Essential hypertension, benign; and Unwanted fertility on her problem list.  Patient reports no complaints.  Contractions: Not present. Vag. Bleeding: None.  Movement: Present. Denies leaking of fluid.   The following portions of the patient's history were reviewed and updated as appropriate: allergies, current medications, past family history, past medical history, past social history, past surgical history and problem list. Problem list updated.  Objective:   Vitals:   03/20/17 0845  BP: 122/78  Pulse: (!) 101  Weight: 217 lb 3.2 oz (98.5 kg)    Fetal Status: Fetal Heart Rate (bpm): 148   Movement: Present     General:  Alert, oriented and cooperative. Patient is in no acute distress.  Skin: Skin is warm and dry. No rash noted.   Cardiovascular: Normal heart rate noted  Respiratory: Normal respiratory effort, no problems with respiration noted  Abdomen: Soft, gravid, appropriate for gestational age. Pain/Pressure: Absent     Pelvic:  Cervical exam deferred        Extremities: Normal range of motion.  Edema: None  Mental Status: Normal mood and affect. Normal behavior. Normal judgment and thought content.   Urinalysis:      Assessment and Plan:  Pregnancy: G5P1031 at [redacted]w[redacted]d  1. Supervision of high risk pregnancy, antepartum Stable Some constipation, Colace sent to Pharmacy Declined flu vaccine  2. Chronic hypertension during pregnancy, antepartum Stable  3. Previous cesarean delivery affecting pregnancy, antepartum Desires TOLAC, will need to sign papers   4. Unwanted fertility Will need to sign tubal papers at later  date.  Preterm labor symptoms and general obstetric precautions including but not limited to vaginal bleeding, contractions, leaking of fluid and fetal movement were reviewed in detail with the patient. Please refer to After Visit Summary for other counseling recommendations.  Return in about 4 weeks (around 04/17/2017) for OB visit.   Hermina Staggers, MD

## 2017-03-27 ENCOUNTER — Ambulatory Visit (HOSPITAL_COMMUNITY)
Admission: RE | Admit: 2017-03-27 | Discharge: 2017-03-27 | Disposition: A | Discharge status: Home / Self Care | Payer: Medicaid Other | Source: Ambulatory Visit | Attending: Obstetrics and Gynecology | Admitting: Obstetrics and Gynecology

## 2017-03-27 ENCOUNTER — Encounter (HOSPITAL_COMMUNITY): Payer: Self-pay

## 2017-03-27 ENCOUNTER — Other Ambulatory Visit (HOSPITAL_COMMUNITY): Payer: Self-pay | Admitting: *Deleted

## 2017-03-27 ENCOUNTER — Other Ambulatory Visit (HOSPITAL_COMMUNITY): Payer: Self-pay | Admitting: Obstetrics and Gynecology

## 2017-03-27 DIAGNOSIS — Z3A23 23 weeks gestation of pregnancy: Secondary | ICD-10-CM

## 2017-03-27 DIAGNOSIS — Z362 Encounter for other antenatal screening follow-up: Secondary | ICD-10-CM | POA: Diagnosis present

## 2017-03-27 DIAGNOSIS — O99212 Obesity complicating pregnancy, second trimester: Secondary | ICD-10-CM | POA: Insufficient documentation

## 2017-03-27 DIAGNOSIS — O10919 Unspecified pre-existing hypertension complicating pregnancy, unspecified trimester: Secondary | ICD-10-CM

## 2017-03-27 DIAGNOSIS — I10 Essential (primary) hypertension: Secondary | ICD-10-CM

## 2017-03-27 DIAGNOSIS — O10012 Pre-existing essential hypertension complicating pregnancy, second trimester: Secondary | ICD-10-CM | POA: Diagnosis not present

## 2017-03-27 DIAGNOSIS — O34211 Maternal care for low transverse scar from previous cesarean delivery: Secondary | ICD-10-CM | POA: Diagnosis not present

## 2017-04-17 ENCOUNTER — Ambulatory Visit (INDEPENDENT_AMBULATORY_CARE_PROVIDER_SITE_OTHER): Payer: Medicaid Other | Admitting: Obstetrics & Gynecology

## 2017-04-17 VITALS — BP 124/80 | HR 97 | Wt 217.8 lb

## 2017-04-17 DIAGNOSIS — O0990 Supervision of high risk pregnancy, unspecified, unspecified trimester: Secondary | ICD-10-CM

## 2017-04-17 DIAGNOSIS — O0993 Supervision of high risk pregnancy, unspecified, third trimester: Secondary | ICD-10-CM

## 2017-04-17 DIAGNOSIS — Z23 Encounter for immunization: Secondary | ICD-10-CM | POA: Diagnosis not present

## 2017-04-17 NOTE — Patient Instructions (Signed)
Third Trimester of Pregnancy The third trimester is from week 28 through week 40 (months 7 through 9). The third trimester is a time when the unborn baby (fetus) is growing rapidly. At the end of the ninth month, the fetus is about 20 inches in length and weighs 6-10 pounds. Body changes during your third trimester Your body will continue to go through many changes during pregnancy. The changes vary from woman to woman. During the third trimester:  Your weight will continue to increase. You can expect to gain 25-35 pounds (11-16 kg) by the end of the pregnancy.  You may begin to get stretch marks on your hips, abdomen, and breasts.  You may urinate more often because the fetus is moving lower into your pelvis and pressing on your bladder.  You may develop or continue to have heartburn. This is caused by increased hormones that slow down muscles in the digestive tract.  You may develop or continue to have constipation because increased hormones slow digestion and cause the muscles that push waste through your intestines to relax.  You may develop hemorrhoids. These are swollen veins (varicose veins) in the rectum that can itch or be painful.  You may develop swollen, bulging veins (varicose veins) in your legs.  You may have increased body aches in the pelvis, back, or thighs. This is due to weight gain and increased hormones that are relaxing your joints.  You may have changes in your hair. These can include thickening of your hair, rapid growth, and changes in texture. Some women also have hair loss during or after pregnancy, or hair that feels dry or thin. Your hair will most likely return to normal after your baby is born.  Your breasts will continue to grow and they will continue to become tender. A yellow fluid (colostrum) may leak from your breasts. This is the first milk you are producing for your baby.  Your belly button may stick out.  You may notice more swelling in your hands,  face, or ankles.  You may have increased tingling or numbness in your hands, arms, and legs. The skin on your belly may also feel numb.  You may feel short of breath because of your expanding uterus.  You may have more problems sleeping. This can be caused by the size of your belly, increased need to urinate, and an increase in your body's metabolism.  You may notice the fetus "dropping," or moving lower in your abdomen (lightening).  You may have increased vaginal discharge.  You may notice your joints feel loose and you may have pain around your pelvic bone.  What to expect at prenatal visits You will have prenatal exams every 2 weeks until week 36. Then you will have weekly prenatal exams. During a routine prenatal visit:  You will be weighed to make sure you and the baby are growing normally.  Your blood pressure will be taken.  Your abdomen will be measured to track your baby's growth.  The fetal heartbeat will be listened to.  Any test results from the previous visit will be discussed.  You may have a cervical check near your due date to see if your cervix has softened or thinned (effaced).  You will be tested for Group B streptococcus. This happens between 35 and 37 weeks.  Your health care provider may ask you:  What your birth plan is.  How you are feeling.  If you are feeling the baby move.  If you have had   any abnormal symptoms, such as leaking fluid, bleeding, severe headaches, or abdominal cramping.  If you are using any tobacco products, including cigarettes, chewing tobacco, and electronic cigarettes.  If you have any questions.  Other tests or screenings that may be performed during your third trimester include:  Blood tests that check for low iron levels (anemia).  Fetal testing to check the health, activity level, and growth of the fetus. Testing is done if you have certain medical conditions or if there are problems during the  pregnancy.  Nonstress test (NST). This test checks the health of your baby to make sure there are no signs of problems, such as the baby not getting enough oxygen. During this test, a belt is placed around your belly. The baby is made to move, and its heart rate is monitored during movement.  What is false labor? False labor is a condition in which you feel small, irregular tightenings of the muscles in the womb (contractions) that usually go away with rest, changing position, or drinking water. These are called Braxton Hicks contractions. Contractions may last for hours, days, or even weeks before true labor sets in. If contractions come at regular intervals, become more frequent, increase in intensity, or become painful, you should see your health care provider. What are the signs of labor?  Abdominal cramps.  Regular contractions that start at 10 minutes apart and become stronger and more frequent with time.  Contractions that start on the top of the uterus and spread down to the lower abdomen and back.  Increased pelvic pressure and dull back pain.  A watery or bloody mucus discharge that comes from the vagina.  Leaking of amniotic fluid. This is also known as your "water breaking." It could be a slow trickle or a gush. Let your health care provider know if it has a color or strange odor. If you have any of these signs, call your health care provider right away, even if it is before your due date. Follow these instructions at home: Medicines  Follow your health care provider's instructions regarding medicine use. Specific medicines may be either safe or unsafe to take during pregnancy.  Take a prenatal vitamin that contains at least 600 micrograms (mcg) of folic acid.  If you develop constipation, try taking a stool softener if your health care provider approves. Eating and drinking  Eat a balanced diet that includes fresh fruits and vegetables, whole grains, good sources of protein  such as meat, eggs, or tofu, and low-fat dairy. Your health care provider will help you determine the amount of weight gain that is right for you.  Avoid raw meat and uncooked cheese. These carry germs that can cause birth defects in the baby.  If you have low calcium intake from food, talk to your health care provider about whether you should take a daily calcium supplement.  Eat four or five small meals rather than three large meals a day.  Limit foods that are high in fat and processed sugars, such as fried and sweet foods.  To prevent constipation: ? Drink enough fluid to keep your urine clear or pale yellow. ? Eat foods that are high in fiber, such as fresh fruits and vegetables, whole grains, and beans. Activity  Exercise only as directed by your health care provider. Most women can continue their usual exercise routine during pregnancy. Try to exercise for 30 minutes at least 5 days a week. Stop exercising if you experience uterine contractions.  Avoid heavy   lifting.  Do not exercise in extreme heat or humidity, or at high altitudes.  Wear low-heel, comfortable shoes.  Practice good posture.  You may continue to have sex unless your health care provider tells you otherwise. Relieving pain and discomfort  Take frequent breaks and rest with your legs elevated if you have leg cramps or low back pain.  Take warm sitz baths to soothe any pain or discomfort caused by hemorrhoids. Use hemorrhoid cream if your health care provider approves.  Wear a good support bra to prevent discomfort from breast tenderness.  If you develop varicose veins: ? Wear support pantyhose or compression stockings as told by your healthcare provider. ? Elevate your feet for 15 minutes, 3-4 times a day. Prenatal care  Write down your questions. Take them to your prenatal visits.  Keep all your prenatal visits as told by your health care provider. This is important. Safety  Wear your seat belt at  all times when driving.  Make a list of emergency phone numbers, including numbers for family, friends, the hospital, and police and fire departments. General instructions  Avoid cat litter boxes and soil used by cats. These carry germs that can cause birth defects in the baby. If you have a cat, ask someone to clean the litter box for you.  Do not travel far distances unless it is absolutely necessary and only with the approval of your health care provider.  Do not use hot tubs, steam rooms, or saunas.  Do not drink alcohol.  Do not use any products that contain nicotine or tobacco, such as cigarettes and e-cigarettes. If you need help quitting, ask your health care provider.  Do not use any medicinal herbs or unprescribed drugs. These chemicals affect the formation and growth of the baby.  Do not douche or use tampons or scented sanitary pads.  Do not cross your legs for long periods of time.  To prepare for the arrival of your baby: ? Take prenatal classes to understand, practice, and ask questions about labor and delivery. ? Make a trial run to the hospital. ? Visit the hospital and tour the maternity area. ? Arrange for maternity or paternity leave through employers. ? Arrange for family and friends to take care of pets while you are in the hospital. ? Purchase a rear-facing car seat and make sure you know how to install it in your car. ? Pack your hospital bag. ? Prepare the baby's nursery. Make sure to remove all pillows and stuffed animals from the baby's crib to prevent suffocation.  Visit your dentist if you have not gone during your pregnancy. Use a soft toothbrush to brush your teeth and be gentle when you floss. Contact a health care provider if:  You are unsure if you are in labor or if your water has broken.  You become dizzy.  You have mild pelvic cramps, pelvic pressure, or nagging pain in your abdominal area.  You have lower back pain.  You have persistent  nausea, vomiting, or diarrhea.  You have an unusual or bad smelling vaginal discharge.  You have pain when you urinate. Get help right away if:  Your water breaks before 37 weeks.  You have regular contractions less than 5 minutes apart before 37 weeks.  You have a fever.  You are leaking fluid from your vagina.  You have spotting or bleeding from your vagina.  You have severe abdominal pain or cramping.  You have rapid weight loss or weight gain.    You have shortness of breath with chest pain.  You notice sudden or extreme swelling of your face, hands, ankles, feet, or legs.  Your baby makes fewer than 10 movements in 2 hours.  You have severe headaches that do not go away when you take medicine.  You have vision changes. Summary  The third trimester is from week 28 through week 40, months 7 through 9. The third trimester is a time when the unborn baby (fetus) is growing rapidly.  During the third trimester, your discomfort may increase as you and your baby continue to gain weight. You may have abdominal, leg, and back pain, sleeping problems, and an increased need to urinate.  During the third trimester your breasts will keep growing and they will continue to become tender. A yellow fluid (colostrum) may leak from your breasts. This is the first milk you are producing for your baby.  False labor is a condition in which you feel small, irregular tightenings of the muscles in the womb (contractions) that eventually go away. These are called Braxton Hicks contractions. Contractions may last for hours, days, or even weeks before true labor sets in.  Signs of labor can include: abdominal cramps; regular contractions that start at 10 minutes apart and become stronger and more frequent with time; watery or bloody mucus discharge that comes from the vagina; increased pelvic pressure and dull back pain; and leaking of amniotic fluid. This information is not intended to replace advice  given to you by your health care provider. Make sure you discuss any questions you have with your health care provider. Document Released: 06/04/2001 Document Revised: 11/16/2015 Document Reviewed: 08/11/2012 Elsevier Interactive Patient Education  2017 [employer]  

## 2017-04-17 NOTE — Progress Notes (Signed)
   PRENATAL VISIT NOTE  Subjective:  Cynthia Schultz is a 31 y.o. G5P1031 at 2872w6d being seen today for ongoing prenatal care.  She is currently monitored for the following issues for this high-risk pregnancy and has Supervision of high risk pregnancy, antepartum; Previous cesarean delivery affecting pregnancy, antepartum; Chronic hypertension during pregnancy, antepartum; Essential hypertension, benign; and Unwanted fertility on her problem list.  Patient reports  .  Contractions: Not present. Vag. Bleeding: None.  Movement: Present. Denies leaking of fluid.   The following portions of the patient's history were reviewed and updated as appropriate: allergies, current medications, past family history, past medical history, past social history, past surgical history and problem list. Problem list updated.  Objective:   Vitals:   04/17/17 0807  BP: 124/80  Pulse: 97  Weight: 217 lb 12.8 oz (98.8 kg)    Fetal Status: Fetal Heart Rate (bpm): 145   Movement: Present     General:  Alert, oriented and cooperative. Patient is in no acute distress.  Skin: Skin is warm and dry. No rash noted.   Cardiovascular: Normal heart rate noted  Respiratory: Normal respiratory effort, no problems with respiration noted  Abdomen: Soft, gravid, appropriate for gestational age.  Pain/Pressure: Absent     Pelvic: insomnia despite melatonin        Extremities: Normal range of motion.  Edema: None  Mental Status:  Normal mood and affect. Normal behavior. Normal judgment and thought content.   Assessment and Plan:  Pregnancy: G5P1031 at 3472w6d  1. Supervision of high risk pregnancy, antepartum  - CBC - RPR - HIV antibody (with reflex)  2. Need for diphtheria-tetanus-pertussis (Tdap) vaccine   - Tdap vaccine greater than or equal to 7yo IM  Preterm labor symptoms and general obstetric precautions including but not limited to vaginal bleeding, contractions, leaking of fluid and fetal movement were reviewed  in detail with the patient. Please refer to After Visit Summary for other counseling recommendations.  Return in about 2 weeks (around 05/01/2017) for 2 hr BG.   Scheryl DarterJames Cresta Riden, MD

## 2017-04-18 LAB — CBC
HEMATOCRIT: 31.8 % — AB (ref 34.0–46.6)
HEMOGLOBIN: 10.2 g/dL — AB (ref 11.1–15.9)
MCH: 31.7 pg (ref 26.6–33.0)
MCHC: 32.1 g/dL (ref 31.5–35.7)
MCV: 99 fL — ABNORMAL HIGH (ref 79–97)
Platelets: 257 10*3/uL (ref 150–379)
RBC: 3.22 x10E6/uL — AB (ref 3.77–5.28)
RDW: 12.9 % (ref 12.3–15.4)
WBC: 11.9 10*3/uL — AB (ref 3.4–10.8)

## 2017-04-18 LAB — HIV ANTIBODY (ROUTINE TESTING W REFLEX): HIV SCREEN 4TH GENERATION: NONREACTIVE

## 2017-04-18 LAB — RPR: RPR: NONREACTIVE

## 2017-04-24 ENCOUNTER — Encounter (HOSPITAL_COMMUNITY): Payer: Self-pay

## 2017-04-24 ENCOUNTER — Ambulatory Visit (HOSPITAL_COMMUNITY)
Admission: RE | Admit: 2017-04-24 | Discharge: 2017-04-24 | Disposition: A | Discharge status: Home / Self Care | Payer: Medicaid Other | Source: Ambulatory Visit | Attending: Obstetrics and Gynecology | Admitting: Obstetrics and Gynecology

## 2017-04-24 ENCOUNTER — Other Ambulatory Visit (HOSPITAL_COMMUNITY): Payer: Self-pay | Admitting: *Deleted

## 2017-04-24 ENCOUNTER — Other Ambulatory Visit (HOSPITAL_COMMUNITY): Payer: Self-pay | Admitting: Maternal and Fetal Medicine

## 2017-04-24 DIAGNOSIS — Z3A27 27 weeks gestation of pregnancy: Secondary | ICD-10-CM | POA: Insufficient documentation

## 2017-04-24 DIAGNOSIS — O34219 Maternal care for unspecified type scar from previous cesarean delivery: Secondary | ICD-10-CM

## 2017-04-24 DIAGNOSIS — O10919 Unspecified pre-existing hypertension complicating pregnancy, unspecified trimester: Secondary | ICD-10-CM

## 2017-04-24 DIAGNOSIS — O10012 Pre-existing essential hypertension complicating pregnancy, second trimester: Secondary | ICD-10-CM | POA: Diagnosis present

## 2017-04-24 DIAGNOSIS — O99212 Obesity complicating pregnancy, second trimester: Secondary | ICD-10-CM

## 2017-04-24 DIAGNOSIS — E669 Obesity, unspecified: Secondary | ICD-10-CM | POA: Insufficient documentation

## 2017-04-24 DIAGNOSIS — Z362 Encounter for other antenatal screening follow-up: Secondary | ICD-10-CM | POA: Insufficient documentation

## 2017-05-01 ENCOUNTER — Encounter: Payer: Self-pay | Admitting: Obstetrics and Gynecology

## 2017-05-01 ENCOUNTER — Ambulatory Visit (INDEPENDENT_AMBULATORY_CARE_PROVIDER_SITE_OTHER): Payer: Medicaid Other | Admitting: Obstetrics and Gynecology

## 2017-05-01 VITALS — BP 132/80 | HR 108 | Wt 215.7 lb

## 2017-05-01 DIAGNOSIS — O34219 Maternal care for unspecified type scar from previous cesarean delivery: Secondary | ICD-10-CM

## 2017-05-01 DIAGNOSIS — Z3009 Encounter for other general counseling and advice on contraception: Secondary | ICD-10-CM

## 2017-05-01 DIAGNOSIS — O10919 Unspecified pre-existing hypertension complicating pregnancy, unspecified trimester: Secondary | ICD-10-CM

## 2017-05-01 DIAGNOSIS — O0990 Supervision of high risk pregnancy, unspecified, unspecified trimester: Secondary | ICD-10-CM

## 2017-05-01 LAB — POCT URINALYSIS DIP (DEVICE)
BILIRUBIN URINE: NEGATIVE
Glucose, UA: NEGATIVE mg/dL
Ketones, ur: NEGATIVE mg/dL
NITRITE: POSITIVE — AB
Protein, ur: NEGATIVE mg/dL
SPECIFIC GRAVITY, URINE: 1.01 (ref 1.005–1.030)
Urobilinogen, UA: 0.2 mg/dL (ref 0.0–1.0)
pH: 6.5 (ref 5.0–8.0)

## 2017-05-01 NOTE — Patient Instructions (Signed)
Third Trimester of Pregnancy The third trimester is from week 28 through week 40 (months 7 through 9). The third trimester is a time when the unborn baby (fetus) is growing rapidly. At the end of the ninth month, the fetus is about 20 inches in length and weighs 6-10 pounds. Body changes during your third trimester Your body will continue to go through many changes during pregnancy. The changes vary from woman to woman. During the third trimester:  Your weight will continue to increase. You can expect to gain 25-35 pounds (11-16 kg) by the end of the pregnancy.  You may begin to get stretch marks on your hips, abdomen, and breasts.  You may urinate more often because the fetus is moving lower into your pelvis and pressing on your bladder.  You may develop or continue to have heartburn. This is caused by increased hormones that slow down muscles in the digestive tract.  You may develop or continue to have constipation because increased hormones slow digestion and cause the muscles that push waste through your intestines to relax.  You may develop hemorrhoids. These are swollen veins (varicose veins) in the rectum that can itch or be painful.  You may develop swollen, bulging veins (varicose veins) in your legs.  You may have increased body aches in the pelvis, back, or thighs. This is due to weight gain and increased hormones that are relaxing your joints.  You may have changes in your hair. These can include thickening of your hair, rapid growth, and changes in texture. Some women also have hair loss during or after pregnancy, or hair that feels dry or thin. Your hair will most likely return to normal after your baby is born.  Your breasts will continue to grow and they will continue to become tender. A yellow fluid (colostrum) may leak from your breasts. This is the first milk you are producing for your baby.  Your belly button may stick out.  You may notice more swelling in your hands,  face, or ankles.  You may have increased tingling or numbness in your hands, arms, and legs. The skin on your belly may also feel numb.  You may feel short of breath because of your expanding uterus.  You may have more problems sleeping. This can be caused by the size of your belly, increased need to urinate, and an increase in your body's metabolism.  You may notice the fetus "dropping," or moving lower in your abdomen (lightening).  You may have increased vaginal discharge.  You may notice your joints feel loose and you may have pain around your pelvic bone.  What to expect at prenatal visits You will have prenatal exams every 2 weeks until week 36. Then you will have weekly prenatal exams. During a routine prenatal visit:  You will be weighed to make sure you and the baby are growing normally.  Your blood pressure will be taken.  Your abdomen will be measured to track your baby's growth.  The fetal heartbeat will be listened to.  Any test results from the previous visit will be discussed.  You may have a cervical check near your due date to see if your cervix has softened or thinned (effaced).  You will be tested for Group B streptococcus. This happens between 35 and 37 weeks.  Your health care provider may ask you:  What your birth plan is.  How you are feeling.  If you are feeling the baby move.  If you have had   any abnormal symptoms, such as leaking fluid, bleeding, severe headaches, or abdominal cramping.  If you are using any tobacco products, including cigarettes, chewing tobacco, and electronic cigarettes.  If you have any questions.  Other tests or screenings that may be performed during your third trimester include:  Blood tests that check for low iron levels (anemia).  Fetal testing to check the health, activity level, and growth of the fetus. Testing is done if you have certain medical conditions or if there are problems during the  pregnancy.  Nonstress test (NST). This test checks the health of your baby to make sure there are no signs of problems, such as the baby not getting enough oxygen. During this test, a belt is placed around your belly. The baby is made to move, and its heart rate is monitored during movement.  What is false labor? False labor is a condition in which you feel small, irregular tightenings of the muscles in the womb (contractions) that usually go away with rest, changing position, or drinking water. These are called Braxton Hicks contractions. Contractions may last for hours, days, or even weeks before true labor sets in. If contractions come at regular intervals, become more frequent, increase in intensity, or become painful, you should see your health care provider. What are the signs of labor?  Abdominal cramps.  Regular contractions that start at 10 minutes apart and become stronger and more frequent with time.  Contractions that start on the top of the uterus and spread down to the lower abdomen and back.  Increased pelvic pressure and dull back pain.  A watery or bloody mucus discharge that comes from the vagina.  Leaking of amniotic fluid. This is also known as your "water breaking." It could be a slow trickle or a gush. Let your health care provider know if it has a color or strange odor. If you have any of these signs, call your health care provider right away, even if it is before your due date. Follow these instructions at home: Medicines  Follow your health care provider's instructions regarding medicine use. Specific medicines may be either safe or unsafe to take during pregnancy.  Take a prenatal vitamin that contains at least 600 micrograms (mcg) of folic acid.  If you develop constipation, try taking a stool softener if your health care provider approves. Eating and drinking  Eat a balanced diet that includes fresh fruits and vegetables, whole grains, good sources of protein  such as meat, eggs, or tofu, and low-fat dairy. Your health care provider will help you determine the amount of weight gain that is right for you.  Avoid raw meat and uncooked cheese. These carry germs that can cause birth defects in the baby.  If you have low calcium intake from food, talk to your health care provider about whether you should take a daily calcium supplement.  Eat four or five small meals rather than three large meals a day.  Limit foods that are high in fat and processed sugars, such as fried and sweet foods.  To prevent constipation: ? Drink enough fluid to keep your urine clear or pale yellow. ? Eat foods that are high in fiber, such as fresh fruits and vegetables, whole grains, and beans. Activity  Exercise only as directed by your health care provider. Most women can continue their usual exercise routine during pregnancy. Try to exercise for 30 minutes at least 5 days a week. Stop exercising if you experience uterine contractions.  Avoid heavy   lifting.  Do not exercise in extreme heat or humidity, or at high altitudes.  Wear low-heel, comfortable shoes.  Practice good posture.  You may continue to have sex unless your health care provider tells you otherwise. Relieving pain and discomfort  Take frequent breaks and rest with your legs elevated if you have leg cramps or low back pain.  Take warm sitz baths to soothe any pain or discomfort caused by hemorrhoids. Use hemorrhoid cream if your health care provider approves.  Wear a good support bra to prevent discomfort from breast tenderness.  If you develop varicose veins: ? Wear support pantyhose or compression stockings as told by your healthcare provider. ? Elevate your feet for 15 minutes, 3-4 times a day. Prenatal care  Write down your questions. Take them to your prenatal visits.  Keep all your prenatal visits as told by your health care provider. This is important. Safety  Wear your seat belt at  all times when driving.  Make a list of emergency phone numbers, including numbers for family, friends, the hospital, and police and fire departments. General instructions  Avoid cat litter boxes and soil used by cats. These carry germs that can cause birth defects in the baby. If you have a cat, ask someone to clean the litter box for you.  Do not travel far distances unless it is absolutely necessary and only with the approval of your health care provider.  Do not use hot tubs, steam rooms, or saunas.  Do not drink alcohol.  Do not use any products that contain nicotine or tobacco, such as cigarettes and e-cigarettes. If you need help quitting, ask your health care provider.  Do not use any medicinal herbs or unprescribed drugs. These chemicals affect the formation and growth of the baby.  Do not douche or use tampons or scented sanitary pads.  Do not cross your legs for long periods of time.  To prepare for the arrival of your baby: ? Take prenatal classes to understand, practice, and ask questions about labor and delivery. ? Make a trial run to the hospital. ? Visit the hospital and tour the maternity area. ? Arrange for maternity or paternity leave through employers. ? Arrange for family and friends to take care of pets while you are in the hospital. ? Purchase a rear-facing car seat and make sure you know how to install it in your car. ? Pack your hospital bag. ? Prepare the baby's nursery. Make sure to remove all pillows and stuffed animals from the baby's crib to prevent suffocation.  Visit your dentist if you have not gone during your pregnancy. Use a soft toothbrush to brush your teeth and be gentle when you floss. Contact a health care provider if:  You are unsure if you are in labor or if your water has broken.  You become dizzy.  You have mild pelvic cramps, pelvic pressure, or nagging pain in your abdominal area.  You have lower back pain.  You have persistent  nausea, vomiting, or diarrhea.  You have an unusual or bad smelling vaginal discharge.  You have pain when you urinate. Get help right away if:  Your water breaks before 37 weeks.  You have regular contractions less than 5 minutes apart before 37 weeks.  You have a fever.  You are leaking fluid from your vagina.  You have spotting or bleeding from your vagina.  You have severe abdominal pain or cramping.  You have rapid weight loss or weight gain.    You have shortness of breath with chest pain.  You notice sudden or extreme swelling of your face, hands, ankles, feet, or legs.  Your baby makes fewer than 10 movements in 2 hours.  You have severe headaches that do not go away when you take medicine.  You have vision changes. Summary  The third trimester is from week 28 through week 40, months 7 through 9. The third trimester is a time when the unborn baby (fetus) is growing rapidly.  During the third trimester, your discomfort may increase as you and your baby continue to gain weight. You may have abdominal, leg, and back pain, sleeping problems, and an increased need to urinate.  During the third trimester your breasts will keep growing and they will continue to become tender. A yellow fluid (colostrum) may leak from your breasts. This is the first milk you are producing for your baby.  False labor is a condition in which you feel small, irregular tightenings of the muscles in the womb (contractions) that eventually go away. These are called Braxton Hicks contractions. Contractions may last for hours, days, or even weeks before true labor sets in.  Signs of labor can include: abdominal cramps; regular contractions that start at 10 minutes apart and become stronger and more frequent with time; watery or bloody mucus discharge that comes from the vagina; increased pelvic pressure and dull back pain; and leaking of amniotic fluid. This information is not intended to replace advice  given to you by your health care provider. Make sure you discuss any questions you have with your health care provider. Document Released: 06/04/2001 Document Revised: 11/16/2015 Document Reviewed: 08/11/2012 Elsevier Interactive Patient Education  2017 [employer]  

## 2017-05-01 NOTE — Progress Notes (Signed)
Subjective:  Cynthia Schultz is a 31 y.o. G5P1031 at 6621w6d being seen today for ongoing prenatal care.  She is currently monitored for the following issues for this high-risk pregnancy and has Supervision of high risk pregnancy, antepartum; Previous cesarean delivery affecting pregnancy, antepartum; Chronic hypertension during pregnancy, antepartum; Essential hypertension, benign; and Unwanted fertility on their problem list.  Patient reports no complaints.  Contractions: Not present. Vag. Bleeding: None.  Movement: Present. Denies leaking of fluid.   The following portions of the patient's history were reviewed and updated as appropriate: allergies, current medications, past family history, past medical history, past social history, past surgical history and problem list. Problem list updated.  Objective:   Vitals:   05/01/17 0803  BP: 132/80  Pulse: (!) 108  Weight: 97.8 kg (215 lb 11.2 oz)    Fetal Status: Fetal Heart Rate (bpm): 163   Movement: Present     General:  Alert, oriented and cooperative. Patient is in no acute distress.  Skin: Skin is warm and dry. No rash noted.   Cardiovascular: Normal heart rate noted  Respiratory: Normal respiratory effort, no problems with respiration noted  Abdomen: Soft, gravid, appropriate for gestational age. Pain/Pressure: Absent     Pelvic:  Cervical exam deferred        Extremities: Normal range of motion.  Edema: None  Mental Status: Normal mood and affect. Normal behavior. Normal judgment and thought content.   Urinalysis:      Assessment and Plan:  Pregnancy: G5P1031 at 7521w6d  1. Supervision of high risk pregnancy, antepartum Stable Declines flu vaccine - Glucose Tolerance, 2 Hours w/1 Hour  2. Previous cesarean delivery affecting pregnancy, antepartum Desires TOLAC Papers signed today  3. Chronic hypertension during pregnancy, antepartum BP stable Continue with qd BASA Continue with q 4 week growth scans To start weekly  antenatal testing with next OB visit  4. Unwanted fertility BTL papers signed today  Preterm labor symptoms and general obstetric precautions including but not limited to vaginal bleeding, contractions, leaking of fluid and fetal movement were reviewed in detail with the patient. Please refer to After Visit Summary for other counseling recommendations.  Return in about 3 weeks (around 05/22/2017) for OB visit.   Hermina StaggersErvin, Sydnei Ohaver L, MD

## 2017-05-01 NOTE — Progress Notes (Signed)
Educated pt Skin to Skin

## 2017-05-02 ENCOUNTER — Other Ambulatory Visit: Payer: Self-pay | Admitting: *Deleted

## 2017-05-02 ENCOUNTER — Encounter: Payer: Self-pay | Admitting: *Deleted

## 2017-05-02 DIAGNOSIS — O24419 Gestational diabetes mellitus in pregnancy, unspecified control: Secondary | ICD-10-CM | POA: Insufficient documentation

## 2017-05-02 HISTORY — DX: Gestational diabetes mellitus in pregnancy, unspecified control: O24.419

## 2017-05-02 LAB — GLUCOSE TOLERANCE, 2 HOURS W/ 1HR
GLUCOSE, FASTING: 78 mg/dL (ref 65–91)
Glucose, 1 hour: 183 mg/dL — ABNORMAL HIGH (ref 65–179)
Glucose, 2 hour: 162 mg/dL — ABNORMAL HIGH (ref 65–152)

## 2017-05-02 MED ORDER — ACCU-CHEK GUIDE W/DEVICE KIT: 1.0000 | PACK | Freq: Once | 0 refills | Status: AC

## 2017-05-02 MED ORDER — GLUCOSE BLOOD VI STRP: ORAL_STRIP | 12 refills | Status: DC

## 2017-05-02 MED ORDER — ACCU-CHEK FASTCLIX LANCETS MISC: 1.0000 | Freq: Four times a day (QID) | 12 refills | Status: DC

## 2017-05-08 ENCOUNTER — Ambulatory Visit: Payer: Medicaid Other | Admitting: *Deleted

## 2017-05-08 ENCOUNTER — Encounter: Payer: Medicaid Other | Attending: Family Medicine | Admitting: *Deleted

## 2017-05-08 DIAGNOSIS — R7309 Other abnormal glucose: Secondary | ICD-10-CM

## 2017-05-08 DIAGNOSIS — Z713 Dietary counseling and surveillance: Secondary | ICD-10-CM | POA: Diagnosis not present

## 2017-05-08 DIAGNOSIS — R7302 Impaired glucose tolerance (oral): Secondary | ICD-10-CM | POA: Diagnosis not present

## 2017-05-08 NOTE — Progress Notes (Addendum)
  Patient was seen on 05/08/2017 for Gestational Diabetes self-management . She states no history of GDM but that her mother has had type 2 diabetes for about 10 years. 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 Diet history obtained, of note she eats mostly snack or fast foods but has cut down on sodas since she found out about her GDM. The following learning objectives were met by the patient :   States the definition of Gestational Diabetes  States why dietary management is important in controlling blood glucose  Describes the effects of carbohydrates on blood glucose levels  Demonstrates ability to create a balanced meal plan  Demonstrates carbohydrate counting   States when to check blood glucose levels  Demonstrates proper blood glucose monitoring techniques  States the effect of stress and exercise on blood glucose levels  States the importance of limiting caffeine and abstaining from alcohol and smoking  Plan:  Aim for 3 Carb Choices per meal (45 grams) +/- 1 either way  Aim for 1-2 Carbs per snack Begin reading food labels for Total Carbohydrate of foods Consider  increasing your activity level by walking or other activity daily as tolerated Begin checking BG before breakfast and 2 hours after first bite of breakfast, lunch and dinner as directed by MD  Bring Log Book to every medical appointment   Take medication if directed by MD Consider increasing vegetable and fruit servings as tolerated  Patient already has a meter: Accu Chek Guide that she just picked up from [facility] Patient instructed to test pre breakfast and 2 hours each meal as directed by MD  I explained Babyscripts to patient and she is interested. I signed her up and she plans to Accept and enter BG into app instead of BG Log Sheet  Patient instructed to monitor glucose levels: FBS: 60 - 95 mg/dl 2 hour: <120 mg/dl  Patient received the following  handouts:  Nutrition Diabetes and Pregnancy  Carbohydrate Counting List  Patient will be seen for follow-up as needed.

## 2017-05-16 ENCOUNTER — Other Ambulatory Visit: Payer: Self-pay | Admitting: Obstetrics and Gynecology

## 2017-05-16 DIAGNOSIS — O0990 Supervision of high risk pregnancy, unspecified, unspecified trimester: Secondary | ICD-10-CM

## 2017-05-21 ENCOUNTER — Encounter: Payer: Self-pay | Admitting: Advanced Practice Midwife

## 2017-05-21 ENCOUNTER — Ambulatory Visit (INDEPENDENT_AMBULATORY_CARE_PROVIDER_SITE_OTHER): Payer: Medicaid Other | Admitting: Advanced Practice Midwife

## 2017-05-21 ENCOUNTER — Ambulatory Visit (INDEPENDENT_AMBULATORY_CARE_PROVIDER_SITE_OTHER): Payer: Medicaid Other | Admitting: *Deleted

## 2017-05-21 VITALS — BP 127/75 | HR 102 | Wt 216.2 lb

## 2017-05-21 DIAGNOSIS — O10919 Unspecified pre-existing hypertension complicating pregnancy, unspecified trimester: Secondary | ICD-10-CM

## 2017-05-21 DIAGNOSIS — O24410 Gestational diabetes mellitus in pregnancy, diet controlled: Secondary | ICD-10-CM

## 2017-05-21 DIAGNOSIS — O10913 Unspecified pre-existing hypertension complicating pregnancy, third trimester: Secondary | ICD-10-CM

## 2017-05-21 DIAGNOSIS — O0990 Supervision of high risk pregnancy, unspecified, unspecified trimester: Secondary | ICD-10-CM

## 2017-05-21 DIAGNOSIS — O0993 Supervision of high risk pregnancy, unspecified, third trimester: Secondary | ICD-10-CM

## 2017-05-21 NOTE — Patient Instructions (Signed)
Hypertension During Pregnancy Hypertension is also called high blood pressure. High blood pressure means that the force of your blood moving in your body is too strong. When you are pregnant, this condition should be watched carefully. It can cause problems for you and your baby. Follow these instructions at home: Eating and drinking  Drink enough fluid to keep your pee (urine) clear or pale yellow.  Eat healthy foods that are low in salt (sodium). ? Do not add salt to your food. ? Check labels on foods and drinks to see much salt is in them. Look on the label where you see "Sodium." Lifestyle  Do not use any products that contain nicotine or tobacco, such as cigarettes and e-cigarettes. If you need help quitting, ask your doctor.  Do not use alcohol.  Avoid caffeine.  Avoid stress. Rest and get plenty of sleep. General instructions  Take over-the-counter and prescription medicines only as told by your doctor.  While lying down, lie on your left side. This keeps pressure off your baby.  While sitting or lying down, raise (elevate) your feet. Try putting some pillows under your lower legs.  Exercise regularly. Ask your doctor what kinds of exercise are best for you.  Keep all prenatal and follow-up visits as told by your doctor. This is important. Contact a doctor if:  You have symptoms that your doctor told you to watch for, such as: ? Fever. ? Throwing up (vomiting). ? Headache. Get help right away if:  You have very bad pain in your belly (abdomen).  You are throwing up, and this does not get better with treatment.  You suddenly get swelling in your hands, ankles, or face.  You gain 4 lb (1.8 kg) or more in 1 week.  You get bleeding from your vagina.  You have blood in your pee.  You do not feel your baby moving as much as normal.  You have a change in vision.  You have muscle twitching or sudden tightening (spasms).  You have trouble breathing.  Your lips  or fingernails turn blue. This information is not intended to replace advice given to you by your health care provider. Make sure you discuss any questions you have with your health care provider. Document Released: 07/13/2010 Document Revised: 02/20/2016 Document Reviewed: 02/20/2016 Elsevier Interactive Patient Education  2017 [employer]vier Inc.  

## 2017-05-21 NOTE — Progress Notes (Signed)
Patient reports she is using [employer] and likes the app. 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 Patient states her fasting levels are after 12pm as she has been sleeping. Patient reports checking her blood sugars daily. Encouraged patient to reach out to [employer] for support.

## 2017-05-21 NOTE — Progress Notes (Signed)
Pt reports having occasional headaches - denies visual disturbances.  US for growth scheduled tomorrow, BPP added

## 2017-05-21 NOTE — Progress Notes (Signed)
   PRENATAL VISIT NOTE  Subjective:  Cynthia Schultz is a 31 y.o. G5P1031 at 3532w5d being seen today for ongoing prenatal care.  She is currently monitored for the following issues for this high-risk pregnancy and has Supervision of high risk pregnancy, antepartum; Previous cesarean delivery affecting pregnancy, antepartum; Chronic hypertension during pregnancy, antepartum; Essential hypertension, benign; Unwanted fertility; and Gestational diabetes mellitus (GDM) on their problem list.  Patient reports occassional HA's. None now.  Contractions: Not present. Vag. Bleeding: None.  Movement: Present. Denies leaking of fluid.   The following portions of the patient's history were reviewed and updated as appropriate: allergies, current medications, past family history, past medical history, past social history, past surgical history and problem list. Problem list updated.  Objective:   Vitals:   05/21/17 0909  BP: 127/75  Pulse: (!) 102  Weight: 216 lb 3.2 oz (98.1 kg)    Fetal Status: Fetal Heart Rate (bpm): NST   Movement: Present     FHR 160's, mod variability, 15x15 accels, no decels.  Toco: Rare UC's  General:  Alert, oriented and cooperative. Patient is in no acute distress.  Skin: Skin is warm and dry. No rash noted.   Cardiovascular: Normal heart rate noted  Respiratory: Normal respiratory effort, no problems with respiration noted  Abdomen: Soft, gravid, appropriate for gestational age.  Pain/Pressure: Absent     Pelvic: Cervical exam deferred        Extremities: Normal range of motion.  Edema: None  Mental Status:  Normal mood and affect. Normal behavior. Normal judgment and thought content.   Fasting CBGs: 86-89 (0% out of range) PCB:  Rare <120 (<10% out of range) PCL:  Rare <120 (<10% out of range) PCD: Rare <120 (<10% out of range)   Assessment and Plan:  Pregnancy: G5P1031 at 1332w5d  1. Supervision of high risk pregnancy, antepartum  - US MFM FETAL BPP WO NON STRESS;  Future - US MFM FETAL BPP WO NON STRESS; Future  2. Chronic hypertension during pregnancy, antepartum  - US MFM FETAL BPP WO NON STRESS; Future - US MFM FETAL BPP WO NON STRESS; Future  3. Diet controlled gestational diabetes mellitus (GDM) in third trimester - Decrease juice. Add Proteins, complex carbs, fats to level out CBG.  - US MFM FETAL BPP WO NON STRESS; Future - US MFM FETAL BPP WO NON STRESS; Future  Preterm labor symptoms and general obstetric precautions including but not limited to vaginal bleeding, contractions, leaking of fluid and fetal movement were reviewed in detail with the patient. Please refer to After Visit Summary for other counseling recommendations.  Return in about 3 weeks (around 06/12/2017) for NST/BPP and HOB; [facility].   Dorathy KinsmanVirginia Etoy Mcdonnell, CNM

## 2017-05-22 ENCOUNTER — Other Ambulatory Visit: Payer: Medicaid Other

## 2017-05-22 ENCOUNTER — Other Ambulatory Visit (HOSPITAL_COMMUNITY): Payer: Self-pay | Admitting: Maternal & Fetal Medicine

## 2017-05-22 ENCOUNTER — Ambulatory Visit (HOSPITAL_COMMUNITY)
Admission: RE | Admit: 2017-05-22 | Discharge: 2017-05-22 | Disposition: A | Discharge status: Home / Self Care | Payer: Medicaid Other | Source: Ambulatory Visit | Attending: Obstetrics and Gynecology | Admitting: Obstetrics and Gynecology

## 2017-05-22 DIAGNOSIS — O34219 Maternal care for unspecified type scar from previous cesarean delivery: Secondary | ICD-10-CM | POA: Insufficient documentation

## 2017-05-22 DIAGNOSIS — O10919 Unspecified pre-existing hypertension complicating pregnancy, unspecified trimester: Secondary | ICD-10-CM | POA: Diagnosis present

## 2017-05-22 DIAGNOSIS — Z3A31 31 weeks gestation of pregnancy: Secondary | ICD-10-CM | POA: Insufficient documentation

## 2017-05-22 DIAGNOSIS — O10013 Pre-existing essential hypertension complicating pregnancy, third trimester: Secondary | ICD-10-CM | POA: Insufficient documentation

## 2017-05-22 DIAGNOSIS — Z362 Encounter for other antenatal screening follow-up: Secondary | ICD-10-CM | POA: Diagnosis not present

## 2017-05-22 DIAGNOSIS — O24410 Gestational diabetes mellitus in pregnancy, diet controlled: Secondary | ICD-10-CM | POA: Insufficient documentation

## 2017-05-22 DIAGNOSIS — O99213 Obesity complicating pregnancy, third trimester: Secondary | ICD-10-CM | POA: Diagnosis not present

## 2017-05-22 DIAGNOSIS — O0990 Supervision of high risk pregnancy, unspecified, unspecified trimester: Secondary | ICD-10-CM

## 2017-05-29 ENCOUNTER — Telehealth: Payer: Self-pay | Admitting: *Deleted

## 2017-05-29 ENCOUNTER — Ambulatory Visit (INDEPENDENT_AMBULATORY_CARE_PROVIDER_SITE_OTHER): Payer: Medicaid Other | Admitting: *Deleted

## 2017-05-29 ENCOUNTER — Ambulatory Visit: Payer: Self-pay

## 2017-05-29 ENCOUNTER — Other Ambulatory Visit (HOSPITAL_COMMUNITY)
Admission: RE | Admit: 2017-05-29 | Discharge: 2017-05-29 | Disposition: A | Discharge status: Home / Self Care | Payer: Medicaid Other | Source: Ambulatory Visit | Attending: Obstetrics & Gynecology | Admitting: Obstetrics & Gynecology

## 2017-05-29 ENCOUNTER — Ambulatory Visit (INDEPENDENT_AMBULATORY_CARE_PROVIDER_SITE_OTHER): Payer: Medicaid Other | Admitting: Obstetrics & Gynecology

## 2017-05-29 VITALS — BP 122/71 | HR 102 | Wt 219.3 lb

## 2017-05-29 DIAGNOSIS — O10913 Unspecified pre-existing hypertension complicating pregnancy, third trimester: Secondary | ICD-10-CM

## 2017-05-29 DIAGNOSIS — O24410 Gestational diabetes mellitus in pregnancy, diet controlled: Secondary | ICD-10-CM

## 2017-05-29 DIAGNOSIS — Z3A00 Weeks of gestation of pregnancy not specified: Secondary | ICD-10-CM | POA: Insufficient documentation

## 2017-05-29 DIAGNOSIS — N898 Other specified noninflammatory disorders of vagina: Secondary | ICD-10-CM | POA: Insufficient documentation

## 2017-05-29 DIAGNOSIS — O0990 Supervision of high risk pregnancy, unspecified, unspecified trimester: Secondary | ICD-10-CM

## 2017-05-29 DIAGNOSIS — O0993 Supervision of high risk pregnancy, unspecified, third trimester: Secondary | ICD-10-CM

## 2017-05-29 DIAGNOSIS — O10919 Unspecified pre-existing hypertension complicating pregnancy, unspecified trimester: Secondary | ICD-10-CM

## 2017-05-29 LAB — POCT URINALYSIS DIP (DEVICE)
Bilirubin Urine: NEGATIVE
Glucose, UA: NEGATIVE mg/dL
Ketones, ur: NEGATIVE mg/dL
Nitrite: NEGATIVE
PH: 7 (ref 5.0–8.0)
Protein, ur: NEGATIVE mg/dL
SPECIFIC GRAVITY, URINE: 1.015 (ref 1.005–1.030)
UROBILINOGEN UA: 0.2 mg/dL (ref 0.0–1.0)

## 2017-05-29 NOTE — Progress Notes (Signed)
Pt reports that she still has vaginal odor, denies itching or irritation. Self swab of vagina performed for wet prep. Pt denies having headaches or visual disturbances.

## 2017-05-29 NOTE — Telephone Encounter (Signed)
Patient states she has been drinking juice and then checking BG with results being too high. I asked that she limit juice to 4 oz, if at all and to include as part of meal, not by itself. I reviewed that BG targets are under 120 mg/dl at 2 hours after a meal but if she has to check earlier, that the target for 1 hour is 140 mg/dl and to note that on her Log Sheet.  She asked about beverage options in addition to water and I agreed that she could add fresh fruit to her water for flavoring. Crystal Light powder could be another carb free option.  We reviewed additional foods that are low in carbohydrate that could be eaten with little effect on BG numbers.

## 2017-05-29 NOTE — Progress Notes (Signed)
   PRENATAL VISIT NOTE  Subjective:  Cynthia Schultz is a 31 y.o. G5P1031 at 2857w6d being seen today for ongoing prenatal care.  She is currently monitored for the following issues for this high-risk pregnancy and has Supervision of high risk pregnancy, antepartum; Previous cesarean delivery affecting pregnancy, antepartum; Chronic hypertension during pregnancy, antepartum; Essential hypertension, benign; Unwanted fertility; and Gestational diabetes mellitus (GDM) on their problem list.  Patient reports no complaints.  Contractions: Not present. Vag. Bleeding: None.  Movement: Present. Denies leaking of fluid.   The following portions of the patient's history were reviewed and updated as appropriate: allergies, current medications, past family history, past medical history, past social history, past surgical history and problem list. Problem list updated.  Objective:   Vitals:   05/29/17 0758  BP: (!) 142/85  Pulse: (!) 102  Weight: 219 lb 4.8 oz (99.5 kg)    Fetal Status: Fetal Heart Rate (bpm): NST   Movement: Present     General:  Alert, oriented and cooperative. Patient is in no acute distress.  Skin: Skin is warm and dry. No rash noted.   Cardiovascular: Normal heart rate noted  Respiratory: Normal respiratory effort, no problems with respiration noted  Abdomen: Soft, gravid, appropriate for gestational age.  Pain/Pressure: Present     Pelvic: Cervical exam deferred        Extremities: Normal range of motion.  Edema: None  Mental Status:  Normal mood and affect. Normal behavior. Normal judgment and thought content.   Assessment and Plan:  Pregnancy: G5P1031 at 7757w6d  1. Chronic hypertension during pregnancy, antepartum Repeat labs, follow BP - Protein / creatinine ratio, urine - Comprehensive metabolic panel - CBC  2. Diet controlled gestational diabetes mellitus (GDM) in third trimester FBS usually nl but will talk to DM nurse re dietary issues  Preterm labor symptoms and  general obstetric precautions including but not limited to vaginal bleeding, contractions, leaking of fluid and fetal movement were reviewed in detail with the patient. Please refer to After Visit Summary for other counseling recommendations.  Return in about 7 days (around 06/05/2017) for as scheduled. NST and AFI today nl  Scheryl DarterJames Tajanae Guilbault, MD

## 2017-05-29 NOTE — Progress Notes (Signed)

## 2017-05-29 NOTE — Patient Instructions (Signed)

## 2017-05-30 LAB — CERVICOVAGINAL ANCILLARY ONLY
Bacterial vaginitis: NEGATIVE
CANDIDA VAGINITIS: NEGATIVE
TRICH (WINDOWPATH): NEGATIVE

## 2017-06-01 LAB — URINE CULTURE, OB REFLEX

## 2017-06-01 LAB — CULTURE, OB URINE

## 2017-06-03 ENCOUNTER — Other Ambulatory Visit: Payer: Self-pay | Admitting: Obstetrics & Gynecology

## 2017-06-03 DIAGNOSIS — O0990 Supervision of high risk pregnancy, unspecified, unspecified trimester: Secondary | ICD-10-CM

## 2017-06-03 MED ORDER — NITROFURANTOIN MACROCRYSTAL 100 MG PO CAPS: 100.0000 mg | ORAL_CAPSULE | Freq: Two times a day (BID) | ORAL | 0 refills | Status: DC

## 2017-06-04 ENCOUNTER — Encounter: Payer: Self-pay | Admitting: General Practice

## 2017-06-04 LAB — COMPREHENSIVE METABOLIC PANEL
A/G RATIO: 1.3 (ref 1.2–2.2)
ALK PHOS: 69 IU/L (ref 39–117)
ALT: 7 IU/L (ref 0–32)
AST: 12 IU/L (ref 0–40)
Albumin: 3.6 g/dL (ref 3.5–5.5)
BUN / CREAT RATIO: 10 (ref 9–23)
BUN: 6 mg/dL (ref 6–20)
CHLORIDE: 105 mmol/L (ref 96–106)
CO2: 20 mmol/L (ref 20–29)
Calcium: 9 mg/dL (ref 8.7–10.2)
Creatinine, Ser: 0.59 mg/dL (ref 0.57–1.00)
GFR calc non Af Amer: 121 mL/min/{1.73_m2} (ref >59)
GFR, EST AFRICAN AMERICAN: 139 mL/min/{1.73_m2} (ref >59)
GLUCOSE: 80 mg/dL (ref 65–99)
Globulin, Total: 2.7 g/dL (ref 1.5–4.5)
POTASSIUM: 3.8 mmol/L (ref 3.5–5.2)
Sodium: 139 mmol/L (ref 134–144)
TOTAL PROTEIN: 6.3 g/dL (ref 6.0–8.5)

## 2017-06-04 LAB — CBC
HEMATOCRIT: 30.2 % — AB (ref 34.0–46.6)
Hemoglobin: 9.9 g/dL — ABNORMAL LOW (ref 11.1–15.9)
MCH: 31.4 pg (ref 26.6–33.0)
MCHC: 32.8 g/dL (ref 31.5–35.7)
MCV: 96 fL (ref 79–97)
Platelets: 260 10*3/uL (ref 150–379)
RBC: 3.15 x10E6/uL — ABNORMAL LOW (ref 3.77–5.28)
RDW: 13.7 % (ref 12.3–15.4)
WBC: 11.5 10*3/uL — AB (ref 3.4–10.8)

## 2017-06-04 LAB — PROTEIN / CREATININE RATIO, URINE
CREATININE, UR: 54.1 mg/dL
PROTEIN/CREAT RATIO: 451 mg/g{creat} — AB (ref 0–200)
Protein, Ur: 24.4 mg/dL

## 2017-06-05 ENCOUNTER — Ambulatory Visit (INDEPENDENT_AMBULATORY_CARE_PROVIDER_SITE_OTHER): Payer: Medicaid Other | Admitting: *Deleted

## 2017-06-05 ENCOUNTER — Encounter: Payer: Self-pay | Admitting: Obstetrics and Gynecology

## 2017-06-05 ENCOUNTER — Ambulatory Visit (INDEPENDENT_AMBULATORY_CARE_PROVIDER_SITE_OTHER): Payer: Medicaid Other | Admitting: Obstetrics and Gynecology

## 2017-06-05 ENCOUNTER — Ambulatory Visit: Payer: Self-pay

## 2017-06-05 VITALS — BP 132/78 | HR 95 | Wt 220.7 lb

## 2017-06-05 DIAGNOSIS — O24410 Gestational diabetes mellitus in pregnancy, diet controlled: Secondary | ICD-10-CM

## 2017-06-05 DIAGNOSIS — O34219 Maternal care for unspecified type scar from previous cesarean delivery: Secondary | ICD-10-CM

## 2017-06-05 DIAGNOSIS — O10919 Unspecified pre-existing hypertension complicating pregnancy, unspecified trimester: Secondary | ICD-10-CM

## 2017-06-05 DIAGNOSIS — O0990 Supervision of high risk pregnancy, unspecified, unspecified trimester: Secondary | ICD-10-CM

## 2017-06-05 DIAGNOSIS — O10913 Unspecified pre-existing hypertension complicating pregnancy, third trimester: Secondary | ICD-10-CM | POA: Diagnosis present

## 2017-06-05 LAB — POCT URINALYSIS DIP (DEVICE)
BILIRUBIN URINE: NEGATIVE
GLUCOSE, UA: NEGATIVE mg/dL
KETONES UR: NEGATIVE mg/dL
NITRITE: NEGATIVE
PH: 7 (ref 5.0–8.0)
Protein, ur: NEGATIVE mg/dL
Specific Gravity, Urine: 1.015 (ref 1.005–1.030)
Urobilinogen, UA: 0.2 mg/dL (ref 0.0–1.0)

## 2017-06-05 MED ORDER — MISC. DEVICES KIT: 1.0000 | PACK | Freq: Every morning | 0 refills | Status: DC

## 2017-06-05 NOTE — Progress Notes (Signed)

## 2017-06-05 NOTE — Patient Instructions (Signed)
Take your blood pressure once per day when you've been resting. If the top number is > 140 or the bottom number is > 90, rest for 15 minutes and retake it. If either number remains high, please call the office. If the top number is > 160, or the bottom number is > 100, please come to the MA[facility].   Preeclampsia and Eclampsia Preeclampsia is a serious condition that develops only during pregnancy. It is also called toxemia of pregnancy. This condition causes high blood pressure along with other symptoms, such as swelling and headaches. These symptoms may develop as the condition gets worse. Preeclampsia may occur at 20 weeks of pregnancy or later. Diagnosing and treating preeclampsia early is very important. If not treated early, it can cause serious problems for you and your baby. One problem it can lead to is eclampsia, which is a condition that causes muscle jerking or shaking (convulsions or seizures) in the mother. Delivering your baby is the best treatment for preeclampsia or eclampsia. Preeclampsia and eclampsia symptoms usually go away after your baby is born. What are the causes? The cause of preeclampsia is not known. What increases the risk? The following risk factors make you more likely to develop preeclampsia:  Being pregnant for the first time.  Having had preeclampsia during a past pregnancy.  Having a family history of preeclampsia.  Having high blood pressure.  Being pregnant with twins or triplets.  Being 4435 or older.  Being African-American.  Having kidney disease or diabetes.  Having medical conditions such as lupus or blood diseases.  Being very overweight (obese).  What are the signs or symptoms? The earliest signs of preeclampsia are:  High blood pressure.  Increased protein in your urine. Your health care provider will check for this at every visit before you give birth (prenatal visit).  Other symptoms that may develop as the condition gets worse  include:  Severe headaches.  Sudden weight gain.  Swelling of the hands, face, legs, and feet.  Nausea and vomiting.  Vision problems, such as blurred or double vision.  Numbness in the face, arms, legs, and feet.  Urinating less than usual.  Dizziness.  Slurred speech.  Abdominal pain, especially upper abdominal pain.  Convulsions or seizures.  Symptoms generally go away after giving birth. How is this diagnosed? There are no screening tests for preeclampsia. Your health care provider will ask you about symptoms and check for signs of preeclampsia during your prenatal visits. You may also have tests that include:  Urine tests.  Blood tests.  Checking your blood pressure.  Monitoring your baby's heart rate.  Ultrasound.  How is this treated? You and your health care provider will determine the treatment approach that is best for you. Treatment may include:  Having more frequent prenatal exams to check for signs of preeclampsia, if you have an increased risk for preeclampsia.  Bed rest.  Reducing how much salt (sodium) you eat.  Medicine to lower your blood pressure.  Staying in the hospital, if your condition is severe. There, treatment will focus on controlling your blood pressure and the amount of fluids in your body (fluid retention).  You may need to take medicine (magnesium sulfate) to prevent seizures. This medicine may be given as an injection or through an IV tube.  Delivering your baby early, if your condition gets worse. You may have your labor started with medicine (induced), or you may have a cesarean delivery.  Follow these instructions at home: Eating and  drinking   Drink enough fluid to keep your urine clear or pale yellow.  Eat a healthy diet that is low in sodium. Do not add salt to your food. Check nutrition labels to see how much sodium a food or beverage contains.  Avoid caffeine. Lifestyle  Do not use any products that contain  nicotine or tobacco, such as cigarettes and e-cigarettes. If you need help quitting, ask your health care provider.  Do not use alcohol or drugs.  Avoid stress as much as possible. Rest and get plenty of sleep. General instructions  Take over-the-counter and prescription medicines only as told by your health care provider.  When lying down, lie on your side. This keeps pressure off of your baby.  When sitting or lying down, raise (elevate) your feet. Try putting some pillows underneath your lower legs.  Exercise regularly. Ask your health care provider what kinds of exercise are best for you.  Keep all follow-up and prenatal visits as told by your health care provider. This is important. How is this prevented? To prevent preeclampsia or eclampsia from developing during another pregnancy:  Get proper medical care during pregnancy. Your health care provider may be able to prevent preeclampsia or diagnose and treat it early.  Your health care provider may have you take a low-dose aspirin or a calcium supplement during your next pregnancy.  You may have tests of your blood pressure and kidney function after giving birth.  Maintain a healthy weight. Ask your health care provider for help managing weight gain during pregnancy.  Work with your health care provider to manage any long-term (chronic) health conditions you have, such as diabetes or kidney problems.  Contact a health care provider if:  You gain more weight than expected.  You have headaches.  You have nausea or vomiting.  You have abdominal pain.  You feel dizzy or light-headed. Get help right away if:  You develop sudden or severe swelling anywhere in your body. This usually happens in the legs.  You gain 5 lbs (2.3 kg) or more during one week.  You have severe: ? Abdominal pain. ? Headaches. ? Dizziness. ? Vision problems. ? Confusion. ? Nausea or vomiting.  You have a seizure.  You have trouble moving  any part of your body.  You develop numbness in any part of your body.  You have trouble speaking.  You have any abnormal bleeding.  You pass out. This information is not intended to replace advice given to you by your health care provider. Make sure you discuss any questions you have with your health care provider. Document Released: 06/07/2000 Document Revised: 02/06/2016 Document Reviewed: 01/15/2016 Elsevier Interactive Patient Education  [employer]2018 Else[employer] Inc.

## 2017-06-05 NOTE — Progress Notes (Signed)
   PRENATAL VISIT NOTE  Subjective:  Cynthia Schultz is a 31 y.o. G5P1031 at 4452w6d being seen today for ongoing prenatal care.  She is currently monitored for the following issues for this high-risk pregnancy and has Supervision of high risk pregnancy, antepartum; Previous cesarean delivery affecting pregnancy, antepartum; Chronic hypertension during pregnancy, antepartum; Essential hypertension, benign; Unwanted fertility; and Gestational diabetes mellitus (GDM) on their problem list.  Patient reports vaginal pressure.  Contractions: Not present. Vag. Bleeding: None.  Movement: Present. Denies leaking of fluid.   gDM - diet controlled FG: 80s PP: 90s-109  The following portions of the patient's history were reviewed and updated as appropriate: allergies, current medications, past family history, past medical history, past social history, past surgical history and problem list. Problem list updated.  Objective:   Vitals:   06/05/17 0759  BP: 132/78  Pulse: 95  Weight: 220 lb 11.2 oz (100.1 kg)    Fetal Status: Fetal Heart Rate (bpm): NST   Movement: Present     General:  Alert, oriented and cooperative. Patient is in no acute distress.  Skin: Skin is warm and dry. No rash noted.   Cardiovascular: Normal heart rate noted  Respiratory: Normal respiratory effort, no problems with respiration noted  Abdomen: Soft, gravid, appropriate for gestational age.  Pain/Pressure: Present     Pelvic: Cervical exam deferred        Extremities: Normal range of motion.  Edema: None  Mental Status:  Normal mood and affect. Normal behavior. Normal judgment and thought content.   Assessment and Plan:  Pregnancy: G5P1031 at 8352w6d  1. Supervision of high risk pregnancy, antepartum For BTL  2. Chronic hypertension during pregnancy, antepartum No meds On baby ASA Elevated UPCr last week --> ? PEC with out severe features, no baseline UPCr BP well controlled today To start taking BP at home, cuff  sent reviewed s/s PEC Last growth 66th%tile Next growth 06/19/17  3. Diet controlled gestational diabetes mellitus (GDM) in third trimester well controlled  4. Previous cesarean delivery affecting pregnancy, antepartum Papers signed 05/01/17   Preterm labor symptoms and general obstetric precautions including but not limited to vaginal bleeding, contractions, leaking of fluid and fetal movement were reviewed in detail with the patient. Please refer to After Visit Summary for other counseling recommendations.  Return in about 1 week (around 06/12/2017) for weekly as scheduled, OB visit (MD).   Conan BowensKelly M Davis, MD

## 2017-06-12 ENCOUNTER — Ambulatory Visit (INDEPENDENT_AMBULATORY_CARE_PROVIDER_SITE_OTHER): Payer: Medicaid Other | Admitting: *Deleted

## 2017-06-12 ENCOUNTER — Ambulatory Visit (INDEPENDENT_AMBULATORY_CARE_PROVIDER_SITE_OTHER): Payer: Medicaid Other | Admitting: Advanced Practice Midwife

## 2017-06-12 ENCOUNTER — Ambulatory Visit: Payer: Self-pay

## 2017-06-12 VITALS — BP 141/92 | HR 105 | Wt 219.1 lb

## 2017-06-12 DIAGNOSIS — O0993 Supervision of high risk pregnancy, unspecified, third trimester: Secondary | ICD-10-CM

## 2017-06-12 DIAGNOSIS — O24410 Gestational diabetes mellitus in pregnancy, diet controlled: Secondary | ICD-10-CM

## 2017-06-12 DIAGNOSIS — O10919 Unspecified pre-existing hypertension complicating pregnancy, unspecified trimester: Secondary | ICD-10-CM

## 2017-06-12 DIAGNOSIS — O0990 Supervision of high risk pregnancy, unspecified, unspecified trimester: Secondary | ICD-10-CM

## 2017-06-12 DIAGNOSIS — O10913 Unspecified pre-existing hypertension complicating pregnancy, third trimester: Secondary | ICD-10-CM

## 2017-06-12 LAB — POCT URINALYSIS DIP (DEVICE)
Bilirubin Urine: NEGATIVE
GLUCOSE, UA: NEGATIVE mg/dL
Hgb urine dipstick: NEGATIVE
Ketones, ur: NEGATIVE mg/dL
Nitrite: NEGATIVE
PROTEIN: NEGATIVE mg/dL
Specific Gravity, Urine: 1.015 (ref 1.005–1.030)
UROBILINOGEN UA: 0.2 mg/dL (ref 0.0–1.0)
pH: 7 (ref 5.0–8.0)

## 2017-06-12 NOTE — Progress Notes (Signed)
   PRENATAL VISIT NOTE  Subjective:  Cynthia Schultz is a 31 y.o. G5P1031 at 3228w6d being seen today for ongoing prenatal care.  She is currently monitored for the following issues for this high-risk pregnancy and has Supervision of high risk pregnancy, antepartum; Previous cesarean delivery affecting pregnancy, antepartum; Chronic hypertension during pregnancy, antepartum; Essential hypertension, benign; Unwanted fertility; and Gestational diabetes mellitus (GDM) on their problem list.  Patient reports no complaints.  Contractions: Not present. Vag. Bleeding: None.  Movement: Present. Denies leaking of fluid.   The following portions of the patient's history were reviewed and updated as appropriate: allergies, current medications, past family history, past medical history, past social history, past surgical history and problem list. Problem list updated.  Objective:   Vitals:   06/12/17 0748  BP: (!) 141/92  Pulse: (!) 105  Weight: 219 lb 1.6 oz (99.4 kg)    Fetal Status: Fetal Heart Rate (bpm): NST Fundal Height: 36 cm Movement: Present     General:  Alert, oriented and cooperative. Patient is in no acute distress.  Skin: Skin is warm and dry. No rash noted.   Cardiovascular: Normal heart rate noted  Respiratory: Normal respiratory effort, no problems with respiration noted  Abdomen: Soft, gravid, appropriate for gestational age.  Pain/Pressure: Present     Pelvic: Cervical exam deferred        Extremities: Normal range of motion.  Edema: None  Mental Status:  Normal mood and affect. Normal behavior. Normal judgment and thought content.   Assessment and Plan:  Pregnancy: G5P1031 at 5328w6d  1. Supervision of high risk pregnancy, antepartum   2. Chronic hypertension during pregnancy, antepartum--Likely mild Pre-E w/out severe features - Mildly elevated off meds - Plan IOL at 37 weeks - Pre-E precautions  3. Diet controlled gestational diabetes mellitus (GDM) in third trimester -  all in range w/ diet  Preterm labor symptoms and general obstetric precautions including but not limited to vaginal bleeding, contractions, leaking of fluid and fetal movement were reviewed in detail with the patient. Please refer to After Visit Summary for other counseling recommendations.  Return in about 2 weeks (around 06/26/2017) for 1/2, 1/3 or 1/4  NST/BPP and HOB.   Dorathy KinsmanVirginia Elysa Womac, CNM

## 2017-06-12 NOTE — Progress Notes (Signed)
Pt denies H/A or visual disturbances.  US for growth/BPP scheduled on 12/27

## 2017-06-12 NOTE — Progress Notes (Signed)

## 2017-06-12 NOTE — Patient Instructions (Signed)
Preeclampsia and Eclampsia °Preeclampsia is a serious condition that develops only during pregnancy. It is also called toxemia of pregnancy. This condition causes high blood pressure along with other symptoms, such as swelling and headaches. These symptoms may develop as the condition gets worse. Preeclampsia may occur at 20 weeks of pregnancy or later. °Diagnosing and treating preeclampsia early is very important. If not treated early, it can cause serious problems for you and your baby. One problem it can lead to is eclampsia, which is a condition that causes muscle jerking or shaking (convulsions or seizures) in the mother. Delivering your baby is the best treatment for preeclampsia or eclampsia. Preeclampsia and eclampsia symptoms usually go away after your baby is born. °What are the causes? °The cause of preeclampsia is not known. °What increases the risk? °The following risk factors make you more likely to develop preeclampsia: °· Being pregnant for the first time. °· Having had preeclampsia during a past pregnancy. °· Having a family history of preeclampsia. °· Having high blood pressure. °· Being pregnant with twins or triplets. °· Being 35 or older. °· Being African-American. °· Having kidney disease or diabetes. °· Having medical conditions such as lupus or blood diseases. °· Being very overweight (obese). ° °What are the signs or symptoms? °The earliest signs of preeclampsia are: °· High blood pressure. °· Increased protein in your urine. Your health care provider will check for this at every visit before you give birth (prenatal visit). ° °Other symptoms that may develop as the condition gets worse include: °· Severe headaches. °· Sudden weight gain. °· Swelling of the hands, face, legs, and feet. °· Nausea and vomiting. °· Vision problems, such as blurred or double vision. °· Numbness in the face, arms, legs, and feet. °· Urinating less than usual. °· Dizziness. °· Slurred speech. °· Abdominal pain,  especially upper abdominal pain. °· Convulsions or seizures. ° °Symptoms generally go away after giving birth. °How is this diagnosed? °There are no screening tests for preeclampsia. Your health care provider will ask you about symptoms and check for signs of preeclampsia during your prenatal visits. You may also have tests that include: °· Urine tests. °· Blood tests. °· Checking your blood pressure. °· Monitoring your baby’s heart rate. °· Ultrasound. ° °How is this treated? °You and your health care provider will determine the treatment approach that is best for you. Treatment may include: °· Having more frequent prenatal exams to check for signs of preeclampsia, if you have an increased risk for preeclampsia. °· Bed rest. °· Reducing how much salt (sodium) you eat. °· Medicine to lower your blood pressure. °· Staying in the hospital, if your condition is severe. There, treatment will focus on controlling your blood pressure and the amount of fluids in your body (fluid retention). °· You may need to take medicine (magnesium sulfate) to prevent seizures. This medicine may be given as an injection or through an IV tube. °· Delivering your baby early, if your condition gets worse. You may have your labor started with medicine (induced), or you may have a cesarean delivery. ° °Follow these instructions at home: °Eating and drinking ° °· Drink enough fluid to keep your urine clear or pale yellow. °· Eat a healthy diet that is low in sodium. Do not add salt to your food. Check nutrition labels to see how much sodium a food or beverage contains. °· Avoid caffeine. °Lifestyle °· Do not use any products that contain nicotine or tobacco, such as cigarettes   and e-cigarettes. If you need help quitting, ask your health care provider. °· Do not use alcohol or drugs. °· Avoid stress as much as possible. Rest and get plenty of sleep. °General instructions °· Take over-the-counter and prescription medicines only as told by your  health care provider. °· When lying down, lie on your side. This keeps pressure off of your baby. °· When sitting or lying down, raise (elevate) your feet. Try putting some pillows underneath your lower legs. °· Exercise regularly. Ask your health care provider what kinds of exercise are best for you. °· Keep all follow-up and prenatal visits as told by your health care provider. This is important. °How is this prevented? °To prevent preeclampsia or eclampsia from developing during another pregnancy: °· Get proper medical care during pregnancy. Your health care provider may be able to prevent preeclampsia or diagnose and treat it early. °· Your health care provider may have you take a low-dose aspirin or a calcium supplement during your next pregnancy. °· You may have tests of your blood pressure and kidney function after giving birth. °· Maintain a healthy weight. Ask your health care provider for help managing weight gain during pregnancy. °· Work with your health care provider to manage any long-term (chronic) health conditions you have, such as diabetes or kidney problems. ° °Contact a health care provider if: °· You gain more weight than expected. °· You have headaches. °· You have nausea or vomiting. °· You have abdominal pain. °· You feel dizzy or light-headed. °Get help right away if: °· You develop sudden or severe swelling anywhere in your body. This usually happens in the legs. °· You gain 5 lbs (2.3 kg) or more during one week. °· You have severe: °? Abdominal pain. °? Headaches. °? Dizziness. °? Vision problems. °? Confusion. °? Nausea or vomiting. °· You have a seizure. °· You have trouble moving any part of your body. °· You develop numbness in any part of your body. °· You have trouble speaking. °· You have any abnormal bleeding. °· You pass out. °This information is not intended to replace advice given to you by your health care provider. Make sure you discuss any questions you have with your health  care provider. °Document Released: 06/07/2000 Document Revised: 02/06/2016 Document Reviewed: 01/15/2016 °Elsevier Interactive Patient Education © 2018 [employer] ° °

## 2017-06-19 ENCOUNTER — Ambulatory Visit (INDEPENDENT_AMBULATORY_CARE_PROVIDER_SITE_OTHER): Payer: Medicaid Other | Admitting: General Practice

## 2017-06-19 ENCOUNTER — Other Ambulatory Visit (HOSPITAL_COMMUNITY): Payer: Self-pay | Admitting: Maternal & Fetal Medicine

## 2017-06-19 ENCOUNTER — Ambulatory Visit (HOSPITAL_COMMUNITY)
Admission: RE | Admit: 2017-06-19 | Discharge: 2017-06-19 | Disposition: A | Discharge status: Home / Self Care | Payer: Medicaid Other | Source: Ambulatory Visit | Attending: Obstetrics and Gynecology | Admitting: Obstetrics and Gynecology

## 2017-06-19 ENCOUNTER — Encounter (HOSPITAL_COMMUNITY): Payer: Self-pay | Admitting: *Deleted

## 2017-06-19 ENCOUNTER — Telehealth (HOSPITAL_COMMUNITY): Payer: Self-pay | Admitting: *Deleted

## 2017-06-19 ENCOUNTER — Other Ambulatory Visit (HOSPITAL_COMMUNITY)
Admission: RE | Admit: 2017-06-19 | Discharge: 2017-06-19 | Disposition: A | Discharge status: Home / Self Care | Payer: Medicaid Other | Source: Ambulatory Visit | Attending: Obstetrics and Gynecology | Admitting: Obstetrics and Gynecology

## 2017-06-19 ENCOUNTER — Ambulatory Visit (INDEPENDENT_AMBULATORY_CARE_PROVIDER_SITE_OTHER): Payer: Medicaid Other | Admitting: Obstetrics and Gynecology

## 2017-06-19 ENCOUNTER — Encounter: Payer: Self-pay | Admitting: Obstetrics and Gynecology

## 2017-06-19 VITALS — BP 139/75 | HR 97 | Wt 218.0 lb

## 2017-06-19 DIAGNOSIS — O0990 Supervision of high risk pregnancy, unspecified, unspecified trimester: Secondary | ICD-10-CM

## 2017-06-19 DIAGNOSIS — E669 Obesity, unspecified: Secondary | ICD-10-CM | POA: Diagnosis not present

## 2017-06-19 DIAGNOSIS — Z362 Encounter for other antenatal screening follow-up: Secondary | ICD-10-CM | POA: Insufficient documentation

## 2017-06-19 DIAGNOSIS — Z3A35 35 weeks gestation of pregnancy: Secondary | ICD-10-CM

## 2017-06-19 DIAGNOSIS — O24410 Gestational diabetes mellitus in pregnancy, diet controlled: Secondary | ICD-10-CM | POA: Diagnosis not present

## 2017-06-19 DIAGNOSIS — O99213 Obesity complicating pregnancy, third trimester: Secondary | ICD-10-CM | POA: Diagnosis not present

## 2017-06-19 DIAGNOSIS — O10913 Unspecified pre-existing hypertension complicating pregnancy, third trimester: Secondary | ICD-10-CM | POA: Insufficient documentation

## 2017-06-19 DIAGNOSIS — O0993 Supervision of high risk pregnancy, unspecified, third trimester: Secondary | ICD-10-CM

## 2017-06-19 DIAGNOSIS — O34219 Maternal care for unspecified type scar from previous cesarean delivery: Secondary | ICD-10-CM

## 2017-06-19 DIAGNOSIS — O10919 Unspecified pre-existing hypertension complicating pregnancy, unspecified trimester: Secondary | ICD-10-CM

## 2017-06-19 NOTE — Telephone Encounter (Signed)
Preadmission screen  

## 2017-06-19 NOTE — Progress Notes (Signed)
   PRENATAL VISIT NOTE  Subjective:  Cynthia Schultz is a 31 y.o. G5P1031 at 6870w6d being seen today for ongoing prenatal care.  She is currently monitored for the following issues for this high-risk pregnancy and has Supervision of high risk pregnancy, antepartum; Previous cesarean delivery affecting pregnancy, antepartum; Chronic hypertension during pregnancy, antepartum; Essential hypertension, benign; Unwanted fertility; and Gestational diabetes mellitus (GDM) on their problem list.  Patient reports occasional dizziness, some nausea with racing heart.  Contractions: Not present. Vag. Bleeding: None.  Movement: Present. Denies leaking of fluid.   diet controlled FG: 80s PP: < 116  The following portions of the patient's history were reviewed and updated as appropriate: allergies, current medications, past family history, past medical history, past social history, past surgical history and problem list. Problem list updated.  Objective:   Vitals:   06/19/17 1111  BP: 139/75  Pulse: 97  Weight: 218 lb (98.9 kg)    Fetal Status: Fetal Heart Rate (bpm): NST   Movement: Present     General:  Alert, oriented and cooperative. Patient is in no acute distress.  Skin: Skin is warm and dry. No rash noted.   Cardiovascular: Normal heart rate noted  Respiratory: Normal respiratory effort, no problems with respiration noted  Abdomen: Soft, gravid, appropriate for gestational age.  Pain/Pressure: Present     Pelvic: Cervical exam deferred        Extremities: Normal range of motion.  Edema: None  Mental Status:  Normal mood and affect. Normal behavior. Normal judgment and thought content.   Assessment and Plan:  Pregnancy: G5P1031 at 7870w6d  1. Diet controlled gestational diabetes mellitus (GDM) in third trimester CBGs well controlled Cont diet control  2. Chronic hypertension during pregnancy, antepartum Well controlled Reactive NST BPP 6/8 in MFM today (-2 breathing) Return one week for  BPP/NST Will schedule induction for 37 weeks and if BP remain stable, may push back one week  3. Supervision of high risk pregnancy, antepartum - Culture, beta strep (group b only) - GC/Chlamydia probe amp ([place])not at [facility]  4. Previous cesarean delivery affecting pregnancy, antepartum For TOLAC  Preterm labor symptoms and general obstetric precautions including but not limited to vaginal bleeding, contractions, leaking of fluid and fetal movement were reviewed in detail with the patient. Please refer to After Visit Summary for other counseling recommendations.  Return in about 1 week (around 06/26/2017).   Conan BowensKelly M Trust Leh, MD

## 2017-06-19 NOTE — Progress Notes (Signed)
IOL scheduled 1/4 @ 730am

## 2017-06-20 ENCOUNTER — Other Ambulatory Visit: Payer: Self-pay | Admitting: Obstetrics and Gynecology

## 2017-06-20 LAB — GC/CHLAMYDIA PROBE AMP (~~LOC~~) NOT AT ARMC
Chlamydia: NEGATIVE
Neisseria Gonorrhea: NEGATIVE

## 2017-06-24 HISTORY — DX: Maternal care for unspecified type scar from previous cesarean delivery: O34.219

## 2017-06-24 NOTE — L&D Delivery Note (Signed)
Patient is 32 y.o. [id number] 2631w1d admitted for SROM.   Delivery Note At  2054,a viable female was delivered via  VBAC, Presentation: cephalic,LOA. APGAR: 8,9 ; weight pending.   Placenta status: spontaneous, intact. Cord: 3 vessel  Anesthesia:  epidural Episiotomy:  none Lacerations:  2nd degree Suture Repair: 3-0 vicryl Est. Blood Loss (mL): 200  Mom to postpartum.  Baby to Couplet care / Skin to Skin.  Rolm BookbinderAmber Lexandra Rettke, DO [facility]

## 2017-06-26 ENCOUNTER — Ambulatory Visit (INDEPENDENT_AMBULATORY_CARE_PROVIDER_SITE_OTHER): Payer: Medicaid Other | Admitting: Obstetrics and Gynecology

## 2017-06-26 ENCOUNTER — Ambulatory Visit: Payer: Self-pay

## 2017-06-26 ENCOUNTER — Encounter: Payer: Self-pay | Admitting: Obstetrics and Gynecology

## 2017-06-26 ENCOUNTER — Ambulatory Visit (INDEPENDENT_AMBULATORY_CARE_PROVIDER_SITE_OTHER): Payer: Medicaid Other | Admitting: *Deleted

## 2017-06-26 VITALS — BP 130/81 | HR 114 | Wt 219.5 lb

## 2017-06-26 DIAGNOSIS — O24410 Gestational diabetes mellitus in pregnancy, diet controlled: Secondary | ICD-10-CM

## 2017-06-26 DIAGNOSIS — O10919 Unspecified pre-existing hypertension complicating pregnancy, unspecified trimester: Secondary | ICD-10-CM

## 2017-06-26 DIAGNOSIS — O0990 Supervision of high risk pregnancy, unspecified, unspecified trimester: Secondary | ICD-10-CM

## 2017-06-26 DIAGNOSIS — O34219 Maternal care for unspecified type scar from previous cesarean delivery: Secondary | ICD-10-CM

## 2017-06-26 DIAGNOSIS — O10913 Unspecified pre-existing hypertension complicating pregnancy, third trimester: Secondary | ICD-10-CM | POA: Diagnosis present

## 2017-06-26 LAB — CULTURE, BETA STREP (GROUP B ONLY): Strep Gp B Culture: NEGATIVE

## 2017-06-26 NOTE — Progress Notes (Signed)
Subjective:  Cynthia Schultz is a 32 y.o. G5P1031 at 7469w6d being seen today for ongoing prenatal care.  She is currently monitored for the following issues for this high-risk pregnancy and has Supervision of high risk pregnancy, antepartum; Previous cesarean delivery affecting pregnancy, antepartum; Chronic hypertension during pregnancy, antepartum; Essential hypertension, benign; Unwanted fertility; and Gestational diabetes mellitus (GDM) on their problem list.  Patient reports no complaints.   .  .   . Denies leaking of fluid.   The following portions of the patient's history were reviewed and updated as appropriate: allergies, current medications, past family history, past medical history, past social history, past surgical history and problem list. Problem list updated.  Objective:  There were no vitals filed for this visit.  Fetal Status:           General:  Alert, oriented and cooperative. Patient is in no acute distress.  Skin: Skin is warm and dry. No rash noted.   Cardiovascular: Normal heart rate noted  Respiratory: Normal respiratory effort, no problems with respiration noted  Abdomen: Soft, gravid, appropriate for gestational age.       Pelvic:  Cervical exam deferred        Extremities: Normal range of motion.     Mental Status: Normal mood and affect. Normal behavior. Normal judgment and thought content.   Urinalysis:      Assessment and Plan:  Pregnancy: G5P1031 at 969w6d  1. Supervision of high risk pregnancy, antepartum Stable  2. Diet controlled gestational diabetes mellitus (GDM) in third trimester CBG in goal range Continue with diet Normal growth scan 06/19/17 BPP 8/10 today Continue with antenatal testing  3. Chronic hypertension during pregnancy, antepartum BP stable Continue with BASA BPP 8/10 today Continue with antenatal testing  4. Previous cesarean delivery affecting pregnancy, antepartum Desires TOLAC, consent signed  Pt had been scheduled for  IOL. Review of chart reveals indication for IOL at 37  Weeks Pt is GDM, diet control, CHTN without meds  BP normal and normal growth Discussed with pt and made aware on no indication for IOL prior to 40 weeks at present  Term labor symptoms and general obstetric precautions including but not limited to vaginal bleeding, contractions, leaking of fluid and fetal movement were reviewed in detail with the patient. Please refer to After Visit Summary for other counseling recommendations.  No Follow-up on file.   Hermina StaggersErvin, Orley Lawry L, MD

## 2017-06-26 NOTE — Progress Notes (Signed)

## 2017-06-27 ENCOUNTER — Inpatient Hospital Stay (HOSPITAL_COMMUNITY)
Admission: RE | Admit: 2017-06-27 | Discharge: 2017-06-27 | Disposition: A | Discharge status: Home / Self Care | Payer: Medicaid Other | Source: Ambulatory Visit | Attending: Obstetrics and Gynecology | Admitting: Obstetrics and Gynecology

## 2017-06-30 ENCOUNTER — Encounter: Payer: Self-pay | Admitting: General Practice

## 2017-07-01 ENCOUNTER — Encounter: Payer: Self-pay | Admitting: *Deleted

## 2017-07-03 ENCOUNTER — Ambulatory Visit (INDEPENDENT_AMBULATORY_CARE_PROVIDER_SITE_OTHER): Payer: Medicaid Other | Admitting: Obstetrics and Gynecology

## 2017-07-03 ENCOUNTER — Ambulatory Visit: Payer: Self-pay

## 2017-07-03 ENCOUNTER — Other Ambulatory Visit: Payer: Medicaid Other

## 2017-07-03 ENCOUNTER — Encounter: Payer: Self-pay | Admitting: Obstetrics and Gynecology

## 2017-07-03 ENCOUNTER — Ambulatory Visit (INDEPENDENT_AMBULATORY_CARE_PROVIDER_SITE_OTHER): Payer: Medicaid Other | Admitting: *Deleted

## 2017-07-03 VITALS — BP 125/83 | HR 106 | Wt 218.7 lb

## 2017-07-03 DIAGNOSIS — O10913 Unspecified pre-existing hypertension complicating pregnancy, third trimester: Secondary | ICD-10-CM | POA: Diagnosis not present

## 2017-07-03 DIAGNOSIS — O24410 Gestational diabetes mellitus in pregnancy, diet controlled: Secondary | ICD-10-CM

## 2017-07-03 DIAGNOSIS — O10919 Unspecified pre-existing hypertension complicating pregnancy, unspecified trimester: Secondary | ICD-10-CM

## 2017-07-03 DIAGNOSIS — O0990 Supervision of high risk pregnancy, unspecified, unspecified trimester: Secondary | ICD-10-CM

## 2017-07-03 DIAGNOSIS — O34219 Maternal care for unspecified type scar from previous cesarean delivery: Secondary | ICD-10-CM

## 2017-07-03 LAB — POCT URINALYSIS DIP (DEVICE)
BILIRUBIN URINE: NEGATIVE
Glucose, UA: NEGATIVE mg/dL
HGB URINE DIPSTICK: NEGATIVE
Ketones, ur: NEGATIVE mg/dL
NITRITE: NEGATIVE
PH: 7 (ref 5.0–8.0)
Protein, ur: NEGATIVE mg/dL
SPECIFIC GRAVITY, URINE: 1.01 (ref 1.005–1.030)
UROBILINOGEN UA: 0.2 mg/dL (ref 0.0–1.0)

## 2017-07-03 NOTE — Progress Notes (Signed)
Prenatal Visit Note Date: 07/03/2017 Clinic: [facility]  Subjective:  Cynthia Schultz is a 32 y.o. G5P1031 at 8931w6d being seen today for ongoing prenatal care.  She is currently monitored for the following issues for this high-risk pregnancy and has Supervision of high risk pregnancy, antepartum; Previous cesarean delivery affecting pregnancy, antepartum; Chronic hypertension during pregnancy, antepartum; Essential hypertension, benign; Unwanted fertility; and Gestational diabetes mellitus (GDM) on their problem list.  Patient reports feelings some UCs and low belly pressure.   Contractions: Not present. Vag. Bleeding: None.  Movement: Present. Denies leaking of fluid.   The following portions of the patient's history were reviewed and updated as appropriate: allergies, current medications, past family history, past medical history, past social history, past surgical history and problem list. Problem list updated.  Objective:   Vitals:   07/03/17 1424  BP: 125/83  Pulse: (!) 106  Weight: 218 lb 11.2 oz (99.2 kg)    Fetal Status: Fetal Heart Rate (bpm): NST   Movement: Present     General:  Alert, oriented and cooperative. Patient is in no acute distress.  Skin: Skin is warm and dry. No rash noted.   Cardiovascular: Normal heart rate noted  Respiratory: Normal respiratory effort, no problems with respiration noted  Abdomen: Soft, gravid, appropriate for gestational age. Pain/Pressure: Present     Pelvic:  Cervical exam deferred        Extremities: Normal range of motion.  Edema: None  Mental Status: Normal mood and affect. Normal behavior. Normal judgment and thought content.   Urinalysis: Urine Protein: Negative Urine Glucose: Negative  Assessment and Plan:  Pregnancy: G5P1031 at 4131w6d  1. Supervision of high risk pregnancy, antepartum Routine care. BTL papers signed but she thinks she probably doesn't want one now.  2. Chronic hypertension during pregnancy,  antepartum Doing well on no meds. Continue with weekly testing. 10/10 today and normal growth on 12/27  3. Diet controlled gestational diabetes mellitus (GDM) in third trimester Normal values.   4. Previous cesarean delivery affecting pregnancy, antepartum Desires tolac. States got a stat c-section last time due to FHR issues after epidural placement  Term labor symptoms and general obstetric precautions including but not limited to vaginal bleeding, contractions, leaking of fluid and fetal movement were reviewed in detail with the patient. Please refer to After Visit Summary for other counseling recommendations.  Return in about 7 days (around 07/10/2017) for weekly as scheduled.   [place] BingPickens, Mahika Vanvoorhis, MD

## 2017-07-03 NOTE — Progress Notes (Signed)
Pt reports increased pelvic pain and pressure which is constant.

## 2017-07-03 NOTE — Progress Notes (Signed)

## 2017-07-04 ENCOUNTER — Other Ambulatory Visit: Payer: Medicaid Other

## 2017-07-05 ENCOUNTER — Inpatient Hospital Stay (HOSPITAL_COMMUNITY)
Admission: AD | Admit: 2017-07-05 | Discharge: 2017-07-07 | DRG: 807 | Disposition: A | Discharge status: Home / Self Care | Payer: Medicaid Other | Source: Ambulatory Visit | Attending: Obstetrics and Gynecology | Admitting: Obstetrics and Gynecology

## 2017-07-05 ENCOUNTER — Inpatient Hospital Stay (HOSPITAL_COMMUNITY): Payer: Medicaid Other | Admitting: Anesthesiology

## 2017-07-05 ENCOUNTER — Encounter (HOSPITAL_COMMUNITY): Payer: Self-pay

## 2017-07-05 ENCOUNTER — Other Ambulatory Visit: Payer: Self-pay

## 2017-07-05 DIAGNOSIS — O99214 Obesity complicating childbirth: Secondary | ICD-10-CM | POA: Diagnosis present

## 2017-07-05 DIAGNOSIS — O4292 Full-term premature rupture of membranes, unspecified as to length of time between rupture and onset of labor: Secondary | ICD-10-CM | POA: Diagnosis present

## 2017-07-05 DIAGNOSIS — O24419 Gestational diabetes mellitus in pregnancy, unspecified control: Secondary | ICD-10-CM

## 2017-07-05 DIAGNOSIS — O99334 Smoking (tobacco) complicating childbirth: Secondary | ICD-10-CM | POA: Diagnosis present

## 2017-07-05 DIAGNOSIS — O9902 Anemia complicating childbirth: Secondary | ICD-10-CM | POA: Diagnosis present

## 2017-07-05 DIAGNOSIS — E669 Obesity, unspecified: Secondary | ICD-10-CM | POA: Diagnosis present

## 2017-07-05 DIAGNOSIS — O34219 Maternal care for unspecified type scar from previous cesarean delivery: Secondary | ICD-10-CM

## 2017-07-05 DIAGNOSIS — Z3A38 38 weeks gestation of pregnancy: Secondary | ICD-10-CM

## 2017-07-05 DIAGNOSIS — D649 Anemia, unspecified: Secondary | ICD-10-CM | POA: Diagnosis present

## 2017-07-05 DIAGNOSIS — I10 Essential (primary) hypertension: Secondary | ICD-10-CM

## 2017-07-05 DIAGNOSIS — O24420 Gestational diabetes mellitus in childbirth, diet controlled: Principal | ICD-10-CM | POA: Diagnosis present

## 2017-07-05 DIAGNOSIS — O10919 Unspecified pre-existing hypertension complicating pregnancy, unspecified trimester: Secondary | ICD-10-CM

## 2017-07-05 DIAGNOSIS — F17210 Nicotine dependence, cigarettes, uncomplicated: Secondary | ICD-10-CM | POA: Diagnosis present

## 2017-07-05 DIAGNOSIS — O24410 Gestational diabetes mellitus in pregnancy, diet controlled: Secondary | ICD-10-CM

## 2017-07-05 LAB — COMPREHENSIVE METABOLIC PANEL
ALK PHOS: 114 U/L (ref 38–126)
ALT: 10 U/L — ABNORMAL LOW (ref 14–54)
AST: 16 U/L (ref 15–41)
Albumin: 3.2 g/dL — ABNORMAL LOW (ref 3.5–5.0)
Anion gap: 10 (ref 5–15)
BILIRUBIN TOTAL: 0.4 mg/dL (ref 0.3–1.2)
BUN: 7 mg/dL (ref 6–20)
CALCIUM: 8.8 mg/dL — AB (ref 8.9–10.3)
CO2: 20 mmol/L — ABNORMAL LOW (ref 22–32)
CREATININE: 0.54 mg/dL (ref 0.44–1.00)
Chloride: 106 mmol/L (ref 101–111)
GFR calc Af Amer: >60 mL/min (ref >60)
GFR calc non Af Amer: >60 mL/min (ref >60)
GLUCOSE: 97 mg/dL (ref 65–99)
Potassium: 4.2 mmol/L (ref 3.5–5.1)
Sodium: 136 mmol/L (ref 135–145)
TOTAL PROTEIN: 7.1 g/dL (ref 6.5–8.1)

## 2017-07-05 LAB — CBC
HCT: 34.4 % — ABNORMAL LOW (ref 36.0–46.0)
HCT: 30.6 % — ABNORMAL LOW (ref 36.0–46.0)
HEMOGLOBIN: 11.5 g/dL — AB (ref 12.0–15.0)
HEMOGLOBIN: 10.1 g/dL — AB (ref 12.0–15.0)
MCH: 32 pg (ref 26.0–34.0)
MCH: 31.7 pg (ref 26.0–34.0)
MCHC: 33.4 g/dL (ref 30.0–36.0)
MCHC: 33 g/dL (ref 30.0–36.0)
MCV: 95.8 fL (ref 78.0–100.0)
MCV: 95.9 fL (ref 78.0–100.0)
PLATELETS: 205 10*3/uL (ref 150–400)
Platelets: 245 10*3/uL (ref 150–400)
RBC: 3.59 MIL/uL — ABNORMAL LOW (ref 3.87–5.11)
RBC: 3.19 MIL/uL — AB (ref 3.87–5.11)
RDW: 13.7 % (ref 11.5–15.5)
RDW: 13.6 % (ref 11.5–15.5)
WBC: 16.1 10*3/uL — AB (ref 4.0–10.5)
WBC: 20.9 10*3/uL — AB (ref 4.0–10.5)

## 2017-07-05 LAB — PROTEIN / CREATININE RATIO, URINE
Creatinine, Urine: 233 mg/dL
Protein Creatinine Ratio: 0.13 mg/mg{Cre} (ref 0.00–0.15)
Total Protein, Urine: 30 mg/dL

## 2017-07-05 LAB — GLUCOSE, CAPILLARY
GLUCOSE-CAPILLARY: 115 mg/dL — AB (ref 65–99)
Glucose-Capillary: 115 mg/dL — ABNORMAL HIGH (ref 65–99)
Glucose-Capillary: 159 mg/dL — ABNORMAL HIGH (ref 65–99)

## 2017-07-05 LAB — TYPE AND SCREEN
ABO/RH(D): B POS
ANTIBODY SCREEN: NEGATIVE

## 2017-07-05 LAB — POCT FERN TEST: POCT FERN TEST: POSITIVE

## 2017-07-05 MED ORDER — TETANUS-DIPHTH-ACELL PERTUSSIS 5-2.5-18.5 LF-MCG/0.5 IM SUSP: 0.5000 mL | Freq: Once | INTRAMUSCULAR | Status: DC

## 2017-07-05 MED ORDER — LACTATED RINGERS IV SOLN
INTRAVENOUS | Status: DC
  Administered 2017-07-05 (×2): via INTRAVENOUS

## 2017-07-05 MED ORDER — SENNOSIDES-DOCUSATE SODIUM 8.6-50 MG PO TABS
2.0000 | ORAL_TABLET | ORAL | Status: DC
  Administered 2017-07-06 (×2): 2 via ORAL
  Filled 2017-07-05 (×2): qty 2

## 2017-07-05 MED ORDER — METHYLERGONOVINE MALEATE 0.2 MG/ML IJ SOLN
0.2000 mg | Freq: Once | INTRAMUSCULAR | Status: AC
  Administered 2017-07-05: 0.2 mg via INTRAMUSCULAR

## 2017-07-05 MED ORDER — DIBUCAINE 1 % RE OINT: 1.0000 "application " | TOPICAL_OINTMENT | RECTAL | Status: DC | PRN

## 2017-07-05 MED ORDER — LIDOCAINE HCL (PF) 1 % IJ SOLN
30.0000 mL | INTRAMUSCULAR | Status: DC | PRN
  Filled 2017-07-05: qty 30

## 2017-07-05 MED ORDER — DIPHENHYDRAMINE HCL 25 MG PO CAPS: 25.0000 mg | ORAL_CAPSULE | Freq: Four times a day (QID) | ORAL | Status: DC | PRN

## 2017-07-05 MED ORDER — ONDANSETRON HCL 4 MG/2ML IJ SOLN
4.0000 mg | INTRAMUSCULAR | Status: DC | PRN
Start: 2017-07-05 — End: 2017-07-07

## 2017-07-05 MED ORDER — LACTATED RINGERS IV SOLN: 500.0000 mL | Freq: Once | INTRAVENOUS | Status: DC

## 2017-07-05 MED ORDER — PRENATAL MULTIVITAMIN CH
1.0000 | ORAL_TABLET | Freq: Every day | ORAL | Status: DC
  Administered 2017-07-06 – 2017-07-07 (×2): 1 via ORAL
  Filled 2017-07-05 (×2): qty 1

## 2017-07-05 MED ORDER — ACETAMINOPHEN 325 MG PO TABS: 650.0000 mg | ORAL_TABLET | ORAL | Status: DC | PRN

## 2017-07-05 MED ORDER — COCONUT OIL OIL
1.0000 "application " | TOPICAL_OIL | Status: DC | PRN
  Administered 2017-07-06: 1 via TOPICAL
  Filled 2017-07-05: qty 120

## 2017-07-05 MED ORDER — ZOLPIDEM TARTRATE 5 MG PO TABS: 5.0000 mg | ORAL_TABLET | Freq: Every evening | ORAL | Status: DC | PRN

## 2017-07-05 MED ORDER — EPHEDRINE 5 MG/ML INJ
10.0000 mg | INTRAVENOUS | Status: DC | PRN
  Filled 2017-07-05: qty 2

## 2017-07-05 MED ORDER — OXYTOCIN 40 UNITS IN LACTATED RINGERS INFUSION - SIMPLE MED: 2.5000 [IU]/h | INTRAVENOUS | Status: DC

## 2017-07-05 MED ORDER — OXYCODONE-ACETAMINOPHEN 5-325 MG PO TABS
1.0000 | ORAL_TABLET | ORAL | Status: DC | PRN
Start: 2017-07-05 — End: 2017-07-05

## 2017-07-05 MED ORDER — LACTATED RINGERS IV SOLN
500.0000 mL | INTRAVENOUS | Status: DC | PRN
  Administered 2017-07-05: 250 mL via INTRAVENOUS

## 2017-07-05 MED ORDER — PHENYLEPHRINE 40 MCG/ML (10ML) SYRINGE FOR IV PUSH (FOR BLOOD PRESSURE SUPPORT)
80.0000 ug | PREFILLED_SYRINGE | INTRAVENOUS | Status: DC | PRN
  Filled 2017-07-05: qty 5

## 2017-07-05 MED ORDER — OXYTOCIN BOLUS FROM INFUSION
500.0000 mL | Freq: Once | INTRAVENOUS | Status: AC
  Administered 2017-07-05: 500 mL via INTRAVENOUS

## 2017-07-05 MED ORDER — OXYCODONE-ACETAMINOPHEN 5-325 MG PO TABS: 2.0000 | ORAL_TABLET | ORAL | Status: DC | PRN

## 2017-07-05 MED ORDER — MISOPROSTOL 200 MCG PO TABS
800.0000 ug | ORAL_TABLET | Freq: Once | ORAL | Status: AC
  Administered 2017-07-05: 800 ug via BUCCAL

## 2017-07-05 MED ORDER — WITCH HAZEL-GLYCERIN EX PADS: 1.0000 "application " | MEDICATED_PAD | CUTANEOUS | Status: DC | PRN

## 2017-07-05 MED ORDER — ONDANSETRON HCL 4 MG PO TABS
4.0000 mg | ORAL_TABLET | ORAL | Status: DC | PRN
Start: 2017-07-05 — End: 2017-07-07

## 2017-07-05 MED ORDER — DIPHENHYDRAMINE HCL 50 MG/ML IJ SOLN: 12.5000 mg | INTRAMUSCULAR | Status: DC | PRN

## 2017-07-05 MED ORDER — LIDOCAINE HCL (PF) 1 % IJ SOLN
INTRAMUSCULAR | Status: DC | PRN
  Administered 2017-07-05: 4 mL via EPIDURAL
  Administered 2017-07-05: 6 mL via EPIDURAL

## 2017-07-05 MED ORDER — ONDANSETRON HCL 4 MG/2ML IJ SOLN: 4.0000 mg | Freq: Four times a day (QID) | INTRAMUSCULAR | Status: DC | PRN

## 2017-07-05 MED ORDER — SOD CITRATE-CITRIC ACID 500-334 MG/5ML PO SOLN: 30.0000 mL | ORAL | Status: DC | PRN

## 2017-07-05 MED ORDER — SIMETHICONE 80 MG PO CHEW: 80.0000 mg | CHEWABLE_TABLET | ORAL | Status: DC | PRN

## 2017-07-05 MED ORDER — TERBUTALINE SULFATE 1 MG/ML IJ SOLN
0.2500 mg | Freq: Once | INTRAMUSCULAR | Status: DC | PRN
  Filled 2017-07-05: qty 1

## 2017-07-05 MED ORDER — FENTANYL 2.5 MCG/ML BUPIVACAINE 1/10 % EPIDURAL INFUSION (WH - ANES)
14.0000 mL/h | INTRAMUSCULAR | Status: DC | PRN
  Administered 2017-07-05: 14 mL/h via EPIDURAL
  Filled 2017-07-05: qty 100

## 2017-07-05 MED ORDER — PHENYLEPHRINE 40 MCG/ML (10ML) SYRINGE FOR IV PUSH (FOR BLOOD PRESSURE SUPPORT)
80.0000 ug | PREFILLED_SYRINGE | INTRAVENOUS | Status: DC | PRN
  Filled 2017-07-05: qty 5
  Filled 2017-07-05: qty 10

## 2017-07-05 MED ORDER — OXYTOCIN 40 UNITS IN LACTATED RINGERS INFUSION - SIMPLE MED
1.0000 m[IU]/min | INTRAVENOUS | Status: DC
  Administered 2017-07-05: 2 m[IU]/min via INTRAVENOUS
  Filled 2017-07-05: qty 1000

## 2017-07-05 MED ORDER — ACETAMINOPHEN 325 MG PO TABS
650.0000 mg | ORAL_TABLET | ORAL | Status: DC | PRN
Start: 2017-07-05 — End: 2017-07-07
  Administered 2017-07-06 – 2017-07-07 (×7): 650 mg via ORAL
  Filled 2017-07-05 (×7): qty 2

## 2017-07-05 MED ORDER — IBUPROFEN 600 MG PO TABS
600.0000 mg | ORAL_TABLET | Freq: Four times a day (QID) | ORAL | Status: DC
Start: 2017-07-06 — End: 2017-07-07
  Administered 2017-07-05 – 2017-07-07 (×7): 600 mg via ORAL
  Filled 2017-07-05 (×7): qty 1

## 2017-07-05 MED ORDER — FLEET ENEMA 7-19 GM/118ML RE ENEM
1.0000 | ENEMA | RECTAL | Status: DC | PRN
Start: 2017-07-05 — End: 2017-07-05

## 2017-07-05 MED ORDER — BENZOCAINE-MENTHOL 20-0.5 % EX AERO
1.0000 "application " | INHALATION_SPRAY | CUTANEOUS | Status: DC | PRN
  Administered 2017-07-06: 1 via TOPICAL
  Filled 2017-07-05: qty 56

## 2017-07-05 MED ORDER — MISOPROSTOL 200 MCG PO TABS
ORAL_TABLET | ORAL | Status: AC
  Filled 2017-07-05: qty 4

## 2017-07-05 NOTE — Progress Notes (Signed)
Cynthia Schultz is a 32 y.o. G4P1021 at 3181w1d  admitted for PROM  Subjective: No complaints. Comfortable with epidural. Feeling some occasional pressure.   Objective: BP 130/74   Pulse (!) 106   Temp 98.7 F (37.1 C) (Axillary)   Resp 18   Ht 5\' 7"  (1.702 m)   LMP 10/05/2016 (LMP Unknown)   BMI 34.25 kg/m  No intake/output data recorded. No intake/output data recorded.  FHT:  FHR: 135 bpm, variability: moderate,  accelerations:  Present,  decelerations:  Absent UC:   regular, every 5-6 minutes SVE:   Dilation: 6 Effacement (%): 90 Station: -1 Exam by:: [employer]Heather Hogan  Labs: Lab Results  Component Value Date   WBC 16.1 (H) 07/05/2017   HGB 11.5 (L) 07/05/2017   HCT 34.4 (L) 07/05/2017   MCV 95.8 07/05/2017   PLT 245 07/05/2017    Assessment / Plan: PROM, augmentation with pitocin. Contractions still not close together. No cervical change. Will continue to increase pitocin.   Labor: still early, continue to increase pitocin.  Preeclampsia:  NA Fetal Wellbeing:  Category I Pain Control:  Epidural I/D:  n/a Anticipated MOD:  NSVD  Thressa ShellerHeather Hogan 07/05/2017, 6:17 PM

## 2017-07-05 NOTE — Progress Notes (Addendum)
G4P1 @ 38.[redacted] wksga. Presents to triage for q445min ctx and loss of mucus plug. Denies lof or bleeding. +FM.  EFM applied. SVE: 5.5/90/0 SROM clear and grossley ruptured  GDM diet controled CHTN - no meds   1208: provider notified. Report status of pt given. Ordered to admit  1209: birthing charge nurse notified. Room assignment pending   1220: Labs and IV done   1230: Pt to birthing via bed room 162

## 2017-07-05 NOTE — Anesthesia Preprocedure Evaluation (Signed)
Anesthesia Evaluation  Patient identified by MRN, date of birth, ID band Patient awake    Reviewed: Allergy & Precautions, NPO status , Patient's Chart, lab work & pertinent test results  Airway Mallampati: II  TM Distance: >3 FB Neck ROM: Full    Dental  (+) Dental Advisory Given   Pulmonary Current Smoker,    Pulmonary exam normal breath sounds clear to auscultation       Cardiovascular hypertension, Normal cardiovascular exam Rhythm:Regular Rate:Normal     Neuro/Psych negative neurological ROS  negative psych ROS   GI/Hepatic negative GI ROS, (+)     substance abuse  marijuana use,   Endo/Other  diabetes, GestationalObesity  Renal/GU negative Renal ROS  negative genitourinary   Musculoskeletal negative musculoskeletal ROS (+)   Abdominal   Peds  Hematology  (+) anemia ,   Anesthesia Other Findings   Reproductive/Obstetrics (+) Pregnancy                             Anesthesia Physical Anesthesia Plan  ASA: II  Anesthesia Plan: Epidural   Post-op Pain Management:    Induction:   PONV Risk Score and Plan:   Airway Management Planned: Natural Airway  Additional Equipment:   Intra-op Plan:   Post-operative Plan:   Informed Consent: I have reviewed the patients History and Physical, chart, labs and discussed the procedure including the risks, benefits and alternatives for the proposed anesthesia with the patient or authorized representative who has indicated his/her understanding and acceptance.     Plan Discussed with:   Anesthesia Plan Comments: (Labs reviewed. Platelets acceptable, patient not taking any blood thinning medications. Risks and benefits discussed with patient, patient expressed understanding and wished to proceed.)        Anesthesia Quick Evaluation

## 2017-07-05 NOTE — Anesthesia Procedure Notes (Signed)
Epidural Patient location during procedure: OB Start time: 07/05/2017 1:19 PM End time: 07/05/2017 1:23 PM  Staffing Anesthesiologist: Beryle LatheBrock, Anis Degidio E, MD Performed: anesthesiologist   Preanesthetic Checklist Completed: patient identified, pre-op evaluation, timeout performed, IV checked, risks and benefits discussed and monitors and equipment checked  Epidural Patient position: sitting Prep: DuraPrep Patient monitoring: continuous pulse ox and blood pressure Approach: midline Location: L2-L3 Injection technique: LOR saline  Needle:  Needle type: Tuohy  Needle gauge: 17 G Needle length: 9 cm Needle insertion depth: 8 cm Catheter size: 19 Gauge Catheter at skin depth: 13 cm Test dose: negative and Other (1% lidocaine)  Additional Notes Patient identified. Risks including, but not limited to, bleeding, infection, nerve damage, paralysis, inadequate analgesia, blood pressure changes, nausea, vomiting, allergic reaction, postpartum back pain, itching, and headache were discussed. Patient expressed understanding and wished to proceed. Sterile prep and drape, including hand hygiene, mask, and sterile gloves were used. The patient was positioned and the spine was prepped. The skin was anesthetized with lidocaine. No paraesthesia or other complication noted. The patient did not experience any signs of intravascular injection such as tinnitus or metallic taste in mouth, nor signs of intrathecal spread such as rapid motor block. Please see nursing notes for vital signs. The patient tolerated the procedure well.   Cynthia Schultz, MDReason for block:procedure for pain

## 2017-07-05 NOTE — Anesthesia Pain Management Evaluation Note (Signed)
  CRNA Pain Management Visit Note  Patient: Cynthia Schultz, 32 y.o., female  "Hello I am a [facility] of the anesthesia [facility]. We have an anesthesia team available at all times to provide care throughout the hospital, including epidural management and anesthesia for C-section. I don't know your plan for the delivery whether it a natural birth, water birth, IV sedation, nitrous supplementation, doula or epidural, but we want to meet your pain goals."   1.Was your pain managed to your expectations on prior hospitalizations?   Yes   2.What is your expectation for pain management during this hospitalization?     Epidural  3.How can we help you reach that goal? Epidural in place  Record the patient's initial score and the patient's pain goal.   Pain: 0  Pain Goal: 4 [facility] wants you to be able to say your pain was always managed very well.  Omere Marti 07/05/2017

## 2017-07-05 NOTE — H&P (Signed)
OBSTETRIC ADMISSION HISTORY AND PHYSICAL  Cynthia Schultz is a 32 y.o. female [phone] with IUP at 100w1dby 17 wk u/s presenting for SROM & TOLAC, [facility] GDM (diet controlled). Regular contractions onset 0700, sensation of LOF with red hue, some dark red/Wieman. She reports +FMs, no VB, no blurry vision, headaches or peripheral edema, and RUQ pain.  She plans on breast feeding. She request BTL (papers signed 05/01/17) for birth control. TOLAC consent signed 05/01/17. Reports C-section for FHR after epidural placement. [facility]  Dating: By 17 wk u/s --->  Estimated Date of Delivery: 07/18/17  Sono:  @[redacted]w[redacted]d , CWD, normal anatomy, 323g, 52% EFW   Clinic [facility] Prenatal Labs  Dating 17 wk u/s Blood type: B/Positive/-- (06/11 1025)   Genetic Screen 1 Screen: WNL    AFP: Neg      Antibody:Negative (06/11 1025)  Anatomic [place]Normal anatomy  Rubella: 3.09 (06/11 1025)  GTT Early:               Third trimester:  RPR: Non Reactive (06/11 1025)   Flu vaccine Declined 03/20/17 HBsAg: Negative (06/11 1025)   TDaP vaccine   04/17/17                       Rhogam: NA, B+                     HIV: Non Reactive (06/11 1025)   Baby Food     breast                                          GBS: negative  Contraception BTL, signed papers, but unsure now.  Pap:WNL  Circumcision If boy yes CF negative  Pediatrician    Support Person    Prenatal Classes      Prenatal History/Complications:  Past Medical History: Past Medical History:  Diagnosis Date  . Bartholin's gland abscess 02/22/2016   Drained 7.17.17  . Gestational diabetes    diet controlled  . Hypertension    BORDERLINE no meds  . Metrorrhagia 02/10/2012  . Trichomonas infection     Past Surgical History: Past Surgical History:  Procedure Laterality Date  . CESAREAN SECTION    . WISDOM TOOTH EXTRACTION      Obstetrical History: OB History    Gravida Para Term Preterm AB Living   4 1 1   2 1    SAB TAB  Ectopic Multiple Live Births   1   1   1       Social History: Social History   Socioeconomic History  . Marital status: Married    Spouse name: Not on file  . Number of children: Not on file  . Years of education: Not on file  . Highest education level: Not on file  Social Needs  . Financial resource strain: Not on file  . Food insecurity - worry: Not on file  . Food insecurity - inability: Not on file  . Transportation needs - medical: Not on file  . Transportation needs - non-medical: Not on file  Occupational History  . Not on file  Tobacco Use  . Smoking status: Current Every Day Smoker    Packs/day: 0.25    Types: Cigarettes  . Smokeless tobacco: Never Used  Substance and Sexual Activity  .  Alcohol use: Yes    Alcohol/week: 1.8 oz    Types: 1 Glasses of wine, 1 Cans of beer, 1 Shots of liquor per week    Comment: one glass of wine a week  . Drug use: Yes    Types: Marijuana    Comment: non in last 4 months as of 06/19/2017  . Sexual activity: Yes    Birth control/protection: None  Other Topics Concern  . Not on file  Social History Narrative  . Not on file    Family History: Family History  Problem Relation Age of Onset  . Diabetes Mother   . Cancer Mother        cervix  . Cancer Maternal Grandmother        cervical  . Hypertension Maternal Grandfather     Allergies: No Known Allergies  Medications Prior to Admission  Medication Sig Dispense Refill Last Dose  . acetaminophen (TYLENOL) 500 MG tablet Take 1,000 mg by mouth every 6 (six) hours as needed for mild pain or headache.   07/05/2017 at Unknown time  . Melatonin 10 MG CAPS Take 5 mg by mouth at bedtime as needed (sleep).    07/04/2017 at Unknown time  . Prenatal Vit-Fe Fumarate-FA (PRENATAL VITAMINS) 28-0.8 MG TABS TAKE 1 TABLET BY MOUTH EVERY DAY 30 tablet 5 07/04/2017 at Unknown time  . ACCU-CHEK FASTCLIX LANCETS MISC 1 Device 4 (four) times daily by Percutaneous route. 100 each 12 Taking  .  aspirin EC 81 MG tablet Take 1 tablet (81 mg total) by mouth daily. (Patient not taking: Reported on 06/26/2017) 60 tablet 3 Not Taking at Unknown time  . glucose blood (ACCU-CHEK GUIDE) test strip Use as instructed QID 100 each 12 Taking  . Misc. Devices KIT 1 Device by Does not apply route every morning. 1 each 0 Taking     Review of Systems   All systems reviewed and negative except as stated in HPI  Blood pressure (!) 158/81, pulse 80, temperature 98.3 F (36.8 C), temperature source Oral, resp. rate (!) 28, height 5' 7"  (1.702 m), last menstrual period 10/05/2016, unknown if currently breastfeeding. General appearance: alert, cooperative, appears stated age and no distress Lungs: clear to auscultation bilaterally Heart: regular rate and rhythm Abdomen: soft, non-tender; bowel sounds normal Extremities: Homans sign is negative, no sign of DVT Presentation: cephalic Fetal monitoringBaseline: 125 bpm, Variability: Good {> 6 bpm), Accelerations: Reactive and Decelerations: Early Uterine activityFrequency: Every 2-7 minutes  SVE: 5.5/ 90/0 by MAU RN   Prenatal labs: ABO, Rh: B/Positive/-- (06/11 1025) Antibody: Negative (06/11 1025) Rubella: 3.09 (06/11 1025) RPR: Non Reactive (10/25 0912)  HBsAg: Negative (06/11 1025)  HIV: Non Reactive (10/25 0912)  GBS:   neg  1 hr Glucola WNL Genetic screening  neg Anatomy US WNL  Prenatal Transfer Tool  Maternal Diabetes: Yes:  Diabetes Type:  Diet controlled Genetic Screening: Normal Maternal Ultrasounds/Referrals: Normal Fetal Ultrasounds or other Referrals:  Fetal echo's weekly have been WNL Maternal Substance Abuse:  No Significant Maternal Medications:  None Significant Maternal Lab Results: Lab values include: Group B Strep negative  Results for orders placed or performed during the hospital encounter of 07/05/17 (from the past 24 hour(s))  CBC   Collection Time: 07/05/17 12:20 PM  Result Value Ref Range   WBC 16.1 (H) 4.0 -  10.5 K/uL   RBC 3.59 (L) 3.87 - 5.11 MIL/uL   Hemoglobin 11.5 (L) 12.0 - 15.0 g/dL   HCT 34.4 (L) 36.0 -  46.0 %   MCV 95.8 78.0 - 100.0 fL   MCH 32.0 26.0 - 34.0 pg   MCHC 33.4 30.0 - 36.0 g/dL   RDW 13.7 11.5 - 15.5 %   Platelets 245 150 - 400 K/uL  Fern Test   Collection Time: 07/05/17 12:38 PM  Result Value Ref Range   POCT Fern Test Positive = ruptured amniotic membanes     Patient Active Problem List   Diagnosis Date Noted  . Normal labor 07/05/2017  . Gestational diabetes mellitus (GDM) 05/02/2017  . Unwanted fertility 03/20/2017  . Supervision of high risk pregnancy, antepartum 12/02/2016  . Previous cesarean delivery affecting pregnancy, antepartum 12/02/2016  . Chronic hypertension during pregnancy, antepartum 12/02/2016  . Essential hypertension, benign 12/02/2016    Assessment/Plan:  Lexxie Winberg is a 32 y.o. G4P1021 at 60w1dhere for SROM & TOLAC.   #Labor: IOL for SROM. Expectant management.  #Pain: Plans epidural #FWB: Reactive NST+ #GDM: CBG Q4H #CHTN: not on meds, f/u CTM. P/Cr ordered #ID:  GBS neg #MOF: breast #MOC: undecided #Circ:  yes  PLarwance Rote MD  07/05/2017, 12:54 PM   I confirm that I have verified the information documented in the resident's note and that I have also personally reperformed the physical exam and all medical decision making activities.   HMarcille Buffy1:06 PM 07/05/17

## 2017-07-06 LAB — GLUCOSE, CAPILLARY: Glucose-Capillary: 72 mg/dL (ref 65–99)

## 2017-07-06 LAB — CBC
HCT: 29.3 % — ABNORMAL LOW (ref 36.0–46.0)
HEMOGLOBIN: 9.9 g/dL — AB (ref 12.0–15.0)
MCH: 32.2 pg (ref 26.0–34.0)
MCHC: 33.8 g/dL (ref 30.0–36.0)
MCV: 95.4 fL (ref 78.0–100.0)
PLATELETS: 194 10*3/uL (ref 150–400)
RBC: 3.07 MIL/uL — ABNORMAL LOW (ref 3.87–5.11)
RDW: 13.7 % (ref 11.5–15.5)
WBC: 19.3 10*3/uL — ABNORMAL HIGH (ref 4.0–10.5)

## 2017-07-06 LAB — RPR: RPR Ser Ql: NONREACTIVE

## 2017-07-06 MED ORDER — FERROUS SULFATE 325 (65 FE) MG PO TABS
325.0000 mg | ORAL_TABLET | Freq: Two times a day (BID) | ORAL | Status: DC
  Administered 2017-07-06 – 2017-07-07 (×3): 325 mg via ORAL
  Filled 2017-07-06 (×3): qty 1

## 2017-07-06 MED ORDER — IBUPROFEN 600 MG PO TABS: 600.0000 mg | ORAL_TABLET | Freq: Four times a day (QID) | ORAL | 0 refills | Status: DC

## 2017-07-06 NOTE — Discharge Summary (Signed)
OB Discharge Summary     Patient Name: Cynthia Schultz DOB: 09/08/1985 MRN: [id number] Date of admission: 07/05/2017  Delivering MD: Dannielle Huh )  Date of discharge: 07/07/2017    Admitting diagnosis: SROM, induction of labor Intrauterine pregnancy: 109w1d   Secondary diagnosis:  Active Problems:   Patient Active Problem List   Diagnosis Date Noted  . Normal labor 07/05/2017  . VBAC (vaginal birth after Cesarean) 07/05/2017  . Gestational diabetes mellitus (GDM) 05/02/2017  . Unwanted fertility 03/20/2017  . Supervision of high risk pregnancy, antepartum 12/02/2016  . Previous cesarean delivery affecting pregnancy, antepartum 12/02/2016  . Chronic hypertension during pregnancy, antepartum 12/02/2016  . Essential hypertension, benign 12/02/2016    Additional problems: none     Discharge diagnosis: Term Pregnancy Delivered                                                                                                Post partum procedures:none  Complications: None  Hospital course:  Induction of Labor With Vaginal Delivery   32y.o. yo [id number] 332w1das admitted to the hospital 07/05/2017 for induction of labor.  Indication for induction: PROM.  Patient had an uncomplicated labor course as follows: Membrane Rupture Time/Date: 6:20 AM ,07/05/2017   Intrapartum Procedures: Episiotomy: None [1]                                         Lacerations:  2nd degree [3]  Patient had delivery of a Viable infant.  Information for the patient's newborn:  Cynthia Schultz, Cynthia Schultz[[id number]]Delivery Method: Vaginal, Spontaneous(Filed from Delivery Summary)   07/05/2017  Details of delivery can be found in separate delivery note.  Patient had a routine postpartum course. Blood pressure well controlled without medication. Patient is discharged home 07/07/17.  Physical exam  Vitals:   07/06/17 1300 07/06/17 1824  BP: 129/66 124/61  Pulse: 77 80  Resp: 19 18  Temp: 98.9 F (37.2 C) (!) 97.5  F (36.4 C)  SpO2: 99%     General: alert, cooperative and no distress Lochia: appropriate Uterine Fundus: firm Incision: N/A DVT Evaluation: No evidence of DVT seen on physical exam.  Labs: Results for orders placed or performed during the hospital encounter of 07/05/17 (from the past 24 hour(s))  Glucose, capillary     Status: None   Collection Time: 07/06/17  7:22 AM  Result Value Ref Range   Glucose-Capillary 72 65 - 99 mg/dL     Discharge instruction: per After Visit Summary and "Baby and Me Booklet".  After visit meds:  No Known Allergies  Allergies as of 07/07/2017   No Known Allergies     Medication List    STOP taking these medications   ACCU-CHEK FASTCLIX LANCETS Misc   aspirin EC 81 MG tablet   glucose blood test strip Commonly known as:  ACCU-CHEK GUIDE   Melatonin 10 MG Caps   Misc. Devices Kit     TAKE these medications   acetaminophen 500  MG tablet Commonly known as:  TYLENOL Take 1,000 mg by mouth every 6 (six) hours as needed for mild pain or headache.   ibuprofen 600 MG tablet Commonly known as:  ADVIL,MOTRIN Take 1 tablet (600 mg total) by mouth every 6 (six) hours.   Prenatal Vitamins 28-0.8 MG Tabs TAKE 1 TABLET BY MOUTH EVERY DAY        Diet: routine diet  Activity: Advance as tolerated. Pelvic rest for 6 weeks.   Outpatient follow up:1 week and 6 week follow up (blood pressure check and repeat GTT) Future Appointments: No future appointments.  Follow up Appt: No Follow-up on file.     Postpartum contraception: none, discussed with patient  Newborn Data:,  APGAR (1 MIN): 8   APGAR (5 MINS): 9      Baby Feeding: Breast Disposition:home with mother  Dannielle Huh, DO  07/07/2017

## 2017-07-06 NOTE — Discharge Instructions (Signed)

## 2017-07-06 NOTE — Anesthesia Postprocedure Evaluation (Signed)
Anesthesia Post Note  Patient: Cynthia Schultz  Procedure(s) Performed: AN AD HOC LABOR EPIDURAL     Patient location during evaluation: Mother Baby Anesthesia Type: Epidural Level of consciousness: awake and alert and oriented Pain management: satisfactory to patient Vital Signs Assessment: post-procedure vital signs reviewed and stable Respiratory status: respiratory function stable and spontaneous breathing Cardiovascular status: blood pressure returned to baseline Postop Assessment: no headache, no backache, spinal receding, patient able to bend at knees and adequate PO intake Anesthetic complications: no    Last Vitals:  Vitals:   07/06/17 0440 07/06/17 0508  BP:  136/83  Pulse:  88  Resp: 20 20  Temp: 37.2 C   SpO2:      Last Pain:  Vitals:   07/06/17 0729  TempSrc:   PainSc: 4    Pain Goal: Patients Stated Pain Goal: 2 (07/06/17 0729)               Karleen DolphinFUSSELL,Keane Martelli

## 2017-07-06 NOTE — Progress Notes (Signed)
POSTPARTUM PROGRESS NOTE  Post Partum Day 1 Subjective:  Cynthia Schultz is a 32 y.o. [id number] 1119w1d s/p VBAC.  Patient received cytotec x 1 and methergine x 1 following delivery due to continued trickle of blood. No acute events overnight.  Pt denies problems with ambulating, voiding or po intake.  She denies nausea or vomiting.  Pain is well controlled. Lochia Minimal.   Objective: Blood pressure 136/83, pulse 88, temperature 98.9 F (37.2 C), temperature source Oral, resp. rate 20, height 5\' 7"  (1.702 m), weight 95 kg (209 lb 8 oz), last menstrual period 10/05/2016, SpO2 100 %, unknown if currently breastfeeding.  Physical Exam:  General: alert, cooperative and no distress Lochia:normal flow Chest: no respiratory distress Heart:regular rate, distal pulses intact Abdomen: soft, nontender,  Uterine Fundus: firm, appropriately tender DVT Evaluation: No calf swelling or tenderness Extremities: no edema  Recent Labs    07/05/17 2158 07/06/17 0538  HGB 10.1* 9.9*  HCT 30.6* 29.3*    Assessment/Plan:  ASSESSMENT: Cynthia Girtndrea Rabanal is a 32 y.o. A5W0981G4P2022 5219w1d s/p VBAC. Hgb 10.1 on admission and Hgb 9.9 this am. Start iron.  Plan for discharge tomorrow   LOS: 1 day   Jamille Yoshino MossDO 07/06/2017, 6:57 AM

## 2017-07-07 ENCOUNTER — Encounter: Payer: Self-pay | Admitting: General Practice

## 2017-07-07 NOTE — Lactation Note (Addendum)
This note was copied from a baby's chart. Lactation Consultation Note  Patient Name: Cynthia Schultz ZOXWR'UToday's Date: 07/07/2017 Reason for consult: Initial assessment;1st time breastfeeding;Infant weight loss  Baby is 37 hours old  LC reviewed and updated the doc flow sheets  As LC entered the room baby latched on the left breast with depth, swallows noted.  LC reviewed basics - discussed nutritive vs non - nutritive feeding patterns and the importance  Of watching for hanging out. Mom denies soreness. Sore nipples and engorgement prevention and tx.  Reviewed. Per mom will have a DEBP at home.  LC also mentioned is baby feeds 1st breast softens well, an doesn't feed 2nd breast , to release down to  Comfort, next feeding offer the breast released down.  Per mom feels comfortable with hand expressing.  Mother informed of post-discharge support and given phone number to the lactation department, including  services for phone call assistance; out-patient appointments; and breastfeeding support group. List of other breastfeeding resources in the community given in the handout. Encouraged mother to call for problems or  concerns related to breastfeeding.  Maternal Data Does the patient have breastfeeding experience prior to this delivery?: No(per mom bottle fed 1 baby )  Feeding Feeding Type: (baby latched ) Length of feed: 30 min  LATCH Score Latch: (latched with depth )  Audible Swallowing: (swallows noted )        Hold (Positioning): (mom independent )     Interventions Interventions: Breast feeding basics reviewed  Lactation Tools Discussed/Used WIC Program: No Pump Review: Milk Storage Initiated by:: MAI  Date initiated:: 07/07/17   Consult Status Consult Status: Complete    Cynthia Schultz 07/07/2017, 10:49 AM

## 2017-07-10 ENCOUNTER — Encounter: Payer: Medicaid Other | Admitting: Obstetrics and Gynecology

## 2017-07-10 ENCOUNTER — Other Ambulatory Visit: Payer: Medicaid Other

## 2017-07-11 ENCOUNTER — Ambulatory Visit: Payer: Medicaid Other | Admitting: *Deleted

## 2017-07-11 NOTE — Progress Notes (Addendum)
Here for bp check s/p delivery 07/05/17 vaginally with hx CHTN. BP wnl, reviewed her pp appt and to come fasting for gtt. She voices understanding.   Attestation of Attending Supervision of RN: Evaluation and management procedures were performed by the nurse under my supervision and collaboration.  I have reviewed the nursing note and chart, and I agree with the management and plan.  Carolyn L. Harraway-Smith, M.D., Evern CoreFACOG

## 2017-07-17 ENCOUNTER — Encounter: Payer: Medicaid Other | Admitting: Obstetrics and Gynecology

## 2017-07-17 ENCOUNTER — Other Ambulatory Visit: Payer: Medicaid Other

## 2017-08-19 ENCOUNTER — Other Ambulatory Visit: Payer: Medicaid Other

## 2017-08-19 ENCOUNTER — Ambulatory Visit (INDEPENDENT_AMBULATORY_CARE_PROVIDER_SITE_OTHER): Payer: Medicaid Other | Admitting: Family Medicine

## 2017-08-19 ENCOUNTER — Other Ambulatory Visit: Payer: Self-pay

## 2017-08-19 ENCOUNTER — Encounter: Payer: Self-pay | Admitting: Family Medicine

## 2017-08-19 DIAGNOSIS — O10919 Unspecified pre-existing hypertension complicating pregnancy, unspecified trimester: Secondary | ICD-10-CM

## 2017-08-19 DIAGNOSIS — O0990 Supervision of high risk pregnancy, unspecified, unspecified trimester: Secondary | ICD-10-CM

## 2017-08-19 DIAGNOSIS — Z1389 Encounter for screening for other disorder: Secondary | ICD-10-CM

## 2017-08-19 DIAGNOSIS — O24419 Gestational diabetes mellitus in pregnancy, unspecified control: Secondary | ICD-10-CM

## 2017-08-19 NOTE — Progress Notes (Signed)
  Subjective:     Cynthia Schultz is a [age] y.o. female who presents for a postpartum visit. She is 6 weeks postpartum following a spontaneous vaginal delivery. I have fully reviewed the prenatal and intrapartum course. The delivery was at 38/1 gestational weeks. Outcome: spontaneous vaginal delivery. Anesthesia: epidural. Postpartum course has been unremarkable. Baby's course has been normal. Baby is feeding by breast. Bleeding no bleeding. Bowel function is normal. Bladder function is normal. Patient is not sexually active. Contraception method is none. Postpartum depression screening: negative.  The following portions of the patient's history were reviewed and updated as appropriate: allergies, current medications, past family history, past medical history, past social history, past surgical history and problem list.  Review of Systems Pertinent items are noted in HPI.   Objective:    BP (!) 146/88   Pulse 83   Ht 5\' 6"  (1.676 m)   Wt 91.2 kg (201 lb)   LMP 08/13/2017   Breastfeeding? Yes   BMI 32.44 kg/m   CONSTITUTIONAL: Well-developed, well-nourished female in no acute distress.  EYES: EOM intact, conjunctivae normal, no scleral icterus HEAD: Normocephalic, atraumatic ENT: External right and left ear normal, oropharynx is clear and moist. CARDIOVASCULAR: No cyanosis or edema. 2+ distal pulses.  RESPIRATORY:Effort and breath sounds normal, no problems with respiration noted. GASTROINTESTINAL:Soft, normal bowel sounds, no distention noted.  No tenderness, rebound or guarding.  MUSCULOSKELETAL: Normal range of motion. No tenderness. SKIN: Skin is warm and dry. No rash noted. Not diaphoretic. No erythema. No pallor. NEUROLGIC: Alert and oriented to person, place, and time. Normal reflexes, muscle tone, coordination. No cranial nerve deficit noted. PSYCHIATRIC: Normal mood and affect. Normal behavior. Normal judgment and thought content. HEM/LYMPH/IMMUNOLOGIC: Neck supple, no masses.   Normal thyroid.   Assessment:     6 week postpartum exam.   Plan:    1. Contraception: IUD- follow up in 1 week for placement 2. cHTN- has had elevated blood pressure since 32 years old. Has been checking blood pressure at home and normal. Does not want to start meds today. Advised to follow up with PCP. 3. GTT today due to GDM

## 2017-08-19 NOTE — Patient Instructions (Signed)

## 2017-08-19 NOTE — Progress Notes (Signed)
Undecided about Birth Control

## 2017-08-23 LAB — GLUCOSE TOLERANCE, 2 HOURS
Glucose, 2 hour: 120 mg/dL (ref 65–139)
Glucose, GTT - Fasting: 78 mg/dL (ref 65–99)

## 2017-08-25 ENCOUNTER — Encounter: Payer: Self-pay | Admitting: Obstetrics and Gynecology

## 2017-09-04 ENCOUNTER — Ambulatory Visit (INDEPENDENT_AMBULATORY_CARE_PROVIDER_SITE_OTHER): Payer: Medicaid Other | Admitting: Student

## 2017-09-04 VITALS — BP 147/83 | HR 88 | Wt 206.1 lb

## 2017-09-04 DIAGNOSIS — N759 Disease of Bartholin's gland, unspecified: Secondary | ICD-10-CM | POA: Diagnosis not present

## 2017-09-04 DIAGNOSIS — Z30430 Encounter for insertion of intrauterine contraceptive device: Secondary | ICD-10-CM | POA: Diagnosis not present

## 2017-09-04 LAB — POCT PREGNANCY, URINE: PREG TEST UR: NEGATIVE

## 2017-09-04 MED ORDER — LEVONORGESTREL 19.5 MCG/DAY IU IUD
INTRAUTERINE_SYSTEM | Freq: Once | INTRAUTERINE | Status: AC
  Administered 2017-09-04: 1 via INTRAUTERINE

## 2017-09-04 NOTE — Progress Notes (Signed)
Subjective:     Patient ID: Cynthia Schultz, female   DOB: 1986/06/21, 32 y.o.   MRN: [id number]  HPI Patient Cynthia Schultz is a 32 y.o. [id number] Here for IUD insertion. She is also complaining of her bathrolin's glands draining. She says that this happens of and on and was told it was because her Bartholin's gland gets blocked up. It is not hurting her today but it is annoying and she wants to know what she can do about it.   Review of Systems  Constitutional: Negative.   HENT: Negative.   Respiratory: Negative.   Cardiovascular: Negative.   Gastrointestinal: Negative.   Genitourinary: Positive for vaginal discharge.  Musculoskeletal: Negative.   Neurological: Negative.   Hematological: Negative.   Psychiatric/Behavioral: Negative.        Objective:   Physical Exam  Constitutional: She appears well-developed.  HENT:  Head: Normocephalic.  Eyes: Pupils are equal, round, and reactive to light.  Neck: Normal range of motion.  Abdominal: Soft.  Genitourinary:  Genitourinary Comments: NEFG; yellow pus draining from left Bartholin's. Gland is non-tender, non-palpable. Cervix is pink with no lesions, no discharge in the vagina.    Musculoskeletal: Normal range of motion.  Neurological: She is alert.  Skin: Skin is warm.  Psychiatric: She has a normal mood and affect.  .      Assessment:     1. Encounter for insertion of intrauterine contraceptive device (IUD)   2. Disorder of Bartholin's gland         Plan:     1. IUD inserted; see procedure note.  2. Patient will return in 4 weeks for string check and for appt with MD to discuss Bartholin's gland removal (per Dr. Alysia PennaErvin).      GYNECOLOGY CLINIC PROCEDURE NOTE  Cynthia Schultz is a 32 y.o. [id number] here for Mirena IUD insertion. No GYN concerns.  Last pap smear was on June 2018 and was normal.  IUD Insertion Procedure Note Patient identified, informed consent performed, consent signed.   Discussed risks of irregular  bleeding, cramping, infection, malpositioning or misplacement of the IUD outside the uterus which may require further procedure such as laparoscopy. Time out was performed.  Urine pregnancy test negative.  Speculum placed in the vagina.  Cervix visualized.  Cleaned with Betadine x 2.  Grasped anteriorly with a single tooth tenaculum.  Uterus sounded to 7 cm.  Mirena IUD placed per manufacturer's recommendations.  Strings trimmed to 3 cm. Tenaculum was removed, good hemostasis noted.  Patient tolerated procedure well.   Patient was given post-procedure instructions.  She was advised to have backup contraception for one week.  Patient was also asked to check IUD strings periodically and follow up in 4 weeks for IUD check.    Luna KitchensKathryn Hutchinson Isenberg CNM

## 2017-09-04 NOTE — Addendum Note (Signed)
Addended by: Lorelle GibbsWILSON, Shaniya Tashiro L on: 09/04/2017 01:24 PM   Modules accepted: Orders

## 2017-09-04 NOTE — Patient Instructions (Signed)

## 2017-09-15 ENCOUNTER — Encounter: Payer: Self-pay | Admitting: *Deleted

## 2017-10-02 ENCOUNTER — Ambulatory Visit (INDEPENDENT_AMBULATORY_CARE_PROVIDER_SITE_OTHER): Payer: Medicaid Other | Admitting: Obstetrics and Gynecology

## 2017-10-02 ENCOUNTER — Encounter: Payer: Self-pay | Admitting: Obstetrics and Gynecology

## 2017-10-02 VITALS — BP 137/81 | HR 88 | Ht 67.0 in | Wt 209.9 lb

## 2017-10-02 DIAGNOSIS — N750 Cyst of Bartholin's gland: Secondary | ICD-10-CM

## 2017-10-02 DIAGNOSIS — Z30431 Encounter for routine checking of intrauterine contraceptive device: Secondary | ICD-10-CM

## 2017-10-02 DIAGNOSIS — Z309 Encounter for contraceptive management, unspecified: Secondary | ICD-10-CM | POA: Diagnosis present

## 2017-10-02 NOTE — Progress Notes (Signed)
Obstetrics and Gynecology Visit Return Patient Evaluation  Appointment Date: 10/02/2017  Primary Care Provider: Patient, No Pcp Per  Referring Provider: No ref. provider found  Chief Complaint: IUD string check and eval of left sided bartholin  History of Present Illness:  Cynthia Schultz is a 32 y.o. s/p Liletta IUD approx one month ago. Had drainage of recurrent left sided bartholin and set up with MD for eval.  Patient states AUB stopped about a week ago and that she didn't even know her left sided bartholin was draining at that time; she denies any s/s currently.    Review of Systems:as noted in the History of Present Illness.  Medications and Allergies: reviewed  Physical Exam:  BP 137/81   Pulse 88   Ht 5\' 7"  (1.702 m)   Wt 209 lb 14.4 oz (95.2 kg)   Breastfeeding? Yes   BMI 32.87 kg/m  Body mass index is 32.87 kg/m. General appearance: Well nourished, well developed female in no acute distress.  Abdomen: diffusely non tender to palpation, non distended, and no masses, hernias Neuro/Psych:  Normal mood and affect.    Pelvic exam:  EGBUS normal.  Two IUD strings present seen coming from the cervical os and tucked into fornices EGBUS, vaginal vault and cervix: within normal limits  Assessment: IUD strings present in proper location and normal bartholin's gland x 2; pt doing well  Plan: D/w pt AUB s/p IUD placement can be normal.  Normal exam today. D/w her that if draining spontaneously that's great and strategies given to help with that. Precautions given that if not draining spontaneously despite interventions to call us for same day or next day appt when it gets to a certain size. D/w her like would recommend repeat word placement (she's only had one placed in the past) or to OR for marsupialization. Pt amenable to plan.   RTC: PRN  Cornelia Copaharlie Jaydalyn Demattia, Jr MD Attending Center for [facility](Faculty Practice)

## 2017-10-02 NOTE — Progress Notes (Signed)
Pt states bleed for about 3 weeks straight & just stopped. Has been concerned

## 2017-11-04 ENCOUNTER — Telehealth: Payer: Self-pay

## 2017-11-04 NOTE — Telephone Encounter (Signed)
Pt called and stated that she got an IUD inserted two months ago and she has had a period for 13-17 days.  Pt also stated that she had some fleshy type skin or tissue when she wiped.  Is this normal?

## 2017-11-05 ENCOUNTER — Ambulatory Visit: Payer: Medicaid Other | Admitting: Obstetrics & Gynecology

## 2017-11-05 NOTE — Telephone Encounter (Signed)
Pt called the front desk and stated that she spoke with after hours nurse and was told to get an appt within 24 hours.  I explained to the pt that we can get in tomorrow 11/06/17 at 0830.  Pt stated that she could not come at that time because she had to attend her son's field trip but she can come on Friday.  Scheduled an appt with Dr. Macon Large on 11/07/17 @ 0915.  Pt agreed.

## 2017-11-07 ENCOUNTER — Encounter: Payer: Self-pay | Admitting: Obstetrics & Gynecology

## 2017-11-07 ENCOUNTER — Ambulatory Visit (INDEPENDENT_AMBULATORY_CARE_PROVIDER_SITE_OTHER): Payer: Self-pay | Admitting: Obstetrics & Gynecology

## 2017-11-07 VITALS — BP 139/75 | HR 95 | Ht 65.5 in | Wt 211.8 lb

## 2017-11-07 DIAGNOSIS — Z30431 Encounter for routine checking of intrauterine contraceptive device: Secondary | ICD-10-CM | POA: Diagnosis present

## 2017-11-07 DIAGNOSIS — Z3202 Encounter for pregnancy test, result negative: Secondary | ICD-10-CM

## 2017-11-07 LAB — POCT PREGNANCY, URINE: Preg Test, Ur: NEGATIVE

## 2017-11-07 NOTE — Patient Instructions (Signed)
Return to clinic for any scheduled appointments or for any gynecologic concerns as needed.   

## 2017-11-07 NOTE — Progress Notes (Signed)
GYNECOLOGY OFFICE VISIT NOTE  History:  32 y.o. [id number] here today for IUD surveillance. Reports noticing some "tissue-like" substance during an IUD self-check. Had some irregular bleeding as expected.  Also has a self-draining L Bartholin's cyst currently. She denies any pelvic pain or other concerns.   Past Medical History:  Diagnosis Date  . Bartholin's gland abscess 02/22/2016   Left  . Gestational diabetes mellitus (GDM) 05/02/2017   Negative postpartum testing 07/2017  . Hypertension    BORDERLINE no meds  . Metrorrhagia 02/10/2012  . Trichomonas infection   . VBAC (vaginal birth after Cesarean) 07/05/2017   Please schedule this patient for PP visit in: 1 week High risk pregnancy complicated by: HTN, previous c-section Delivery mode:  vbac Anticipated Birth Control:  undecided PP Procedures needed: BP check  Schedule Integrated BH visit: no Provider: either    Past Surgical History:  Procedure Laterality Date  . CESAREAN SECTION    . WISDOM TOOTH EXTRACTION      The following portions of the patient's history were reviewed and updated as appropriate: allergies, current medications, past family history, past medical history, past social history, past surgical history and problem list.   Health Maintenance:  Normal pap and negative HRHPV on 12/02/2016. Normal full exam/annual during her postpartum visits on 08/2017 and 09/2017.    Review of Systems:  Pertinent items noted in HPI and remainder of comprehensive ROS otherwise negative.  Objective:  Physical Exam BP 139/75   Pulse 95   Ht 5' 5.5" (1.664 m)   Wt 211 lb 12.8 oz (96.1 kg)   BMI 34.71 kg/m  CONSTITUTIONAL: Well-developed, well-nourished female in no acute distress.  HENT:  Normocephalic, atraumatic. External right and left ear normal. Oropharynx is clear and moist EYES: Conjunctivae and EOM are normal. Pupils are equal, round, and reactive to light. No scleral icterus.  NECK: Normal range of motion, supple, no  masses SKIN: Skin is warm and dry. No rash noted. Not diaphoretic. No erythema. No pallor. NEUROLOGIC: Alert and oriented to person, place, and time. Normal reflexes, muscle tone coordination. No cranial nerve deficit noted. PSYCHIATRIC: Normal mood and affect. Normal behavior. Normal judgment and thought content. CARDIOVASCULAR: Normal heart rate noted RESPIRATORY: Effort and breath sounds normal, no problems with respiration noted ABDOMEN: Soft, no distention noted.  MUSCULOSKELETAL: Normal range of motion. No edema noted.  PELVIC: Normal appearing external genitalia except for actively draining small bartholin's cyst. Are pressed to empty the cyst of yellow malodorous drainage. Speculum exam revealed old blood in vault, IUD strings about 3 cm in length from os. Normal appearing vaginal mucosa and cervix.   Normal uterine size, no other palpable masses, no uterine or adnexal tenderness.   Labs and Imaging Results for orders placed or performed in visit on 11/07/17 (from the past 24 hour(s))  Pregnancy, urine POC     Status: None   Collection Time: 11/07/17  8:41 AM  Result Value Ref Range   Preg Test, Ur NEGATIVE NEGATIVE     Assessment & Plan:  1. IUD check up Patient reassured about normal exam Routine side effects of IUD reviewed in detail She will keep it in place as recommended Routine preventative health maintenance measures emphasized. Please refer to After Visit Summary for other counseling recommendations.   Return if symptoms worsen or fail to improve.   Total face-to-face time with patient: 15 minutes.  Over 50% of encounter was spent on counseling and coordination of care.   Jaynie Collins,  MD, [place], [facility] for [employer], [place]

## 2017-11-07 NOTE — Progress Notes (Signed)
Here today because got Liletta 09/04/17 and had a period lasted 17 days, then off 2 days then came back spotting. Checked her IUD strings and felt some tissue. States since then sometimes she feels some tissue/ discharge. Wants to get it checked. UPT today per Dr. Macon Large which was negative.

## 2018-12-02 IMAGING — US US FETAL BPP W/ NON-STRESS
1 series · 10 of 10 positions shown · non-contrast
Comparison: none

[Series 1: us fetal bpp w/nonstress · 10 acquisitions, 10 frames shown]
[im 1/10]
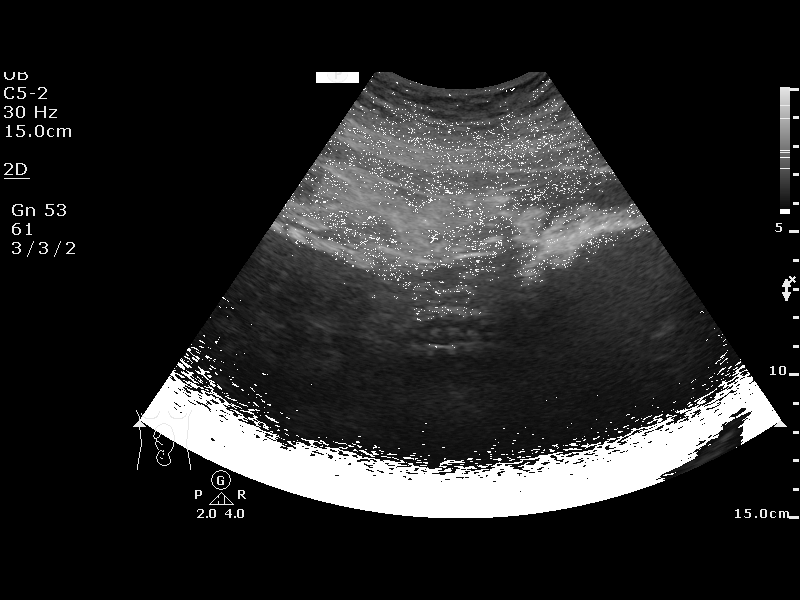
[im 2/10]
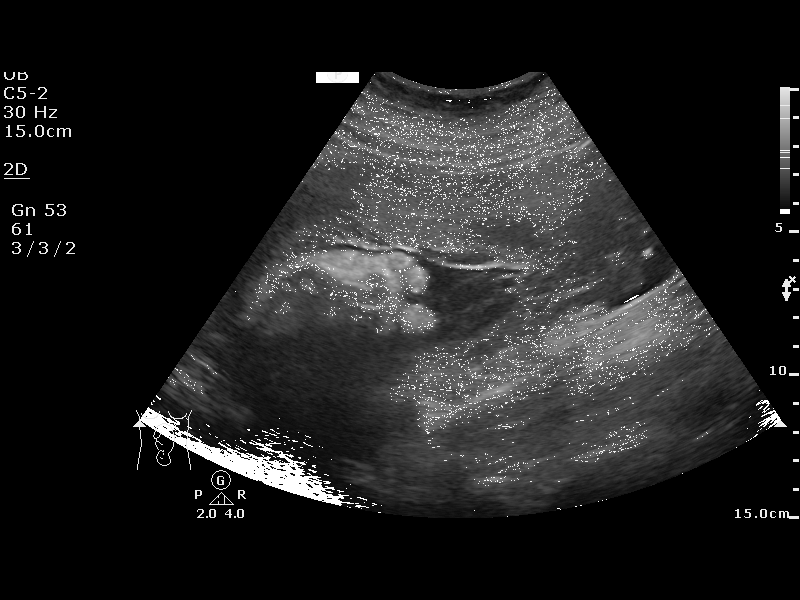
[im 3/10]
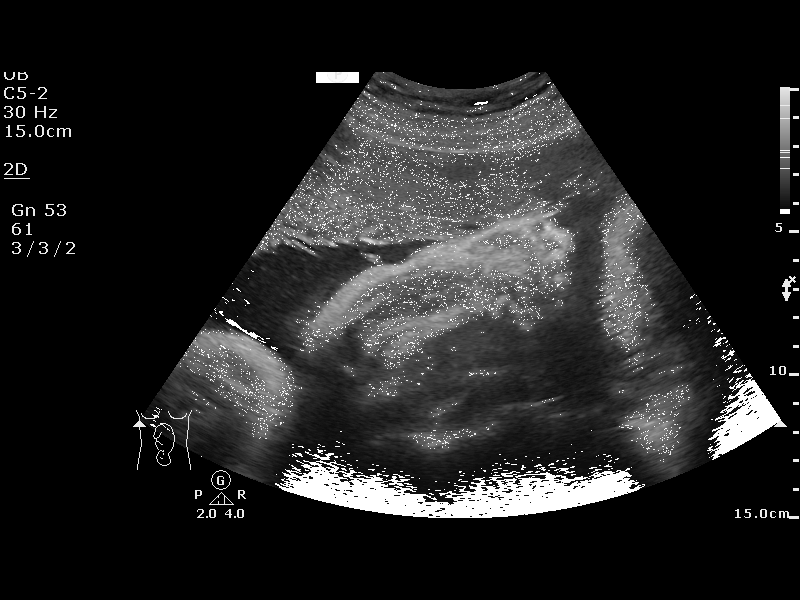
[im 4/10]
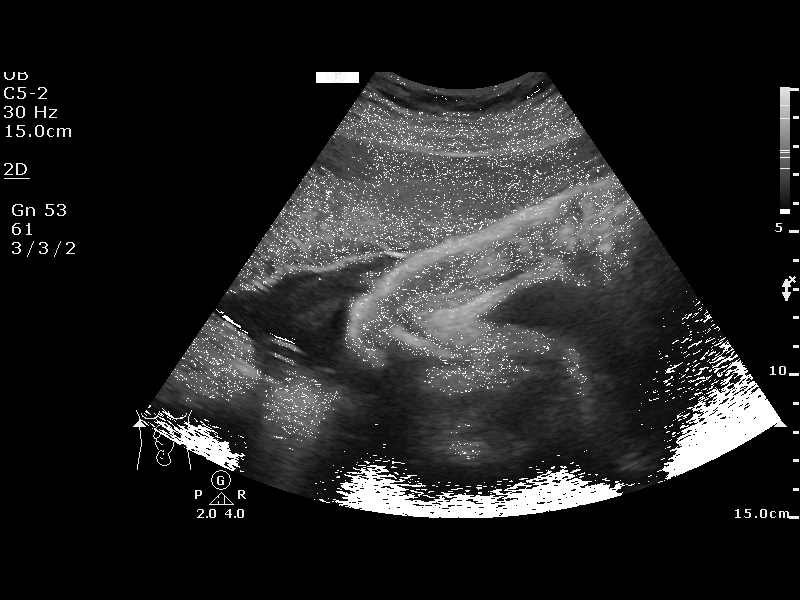
[im 5/10]
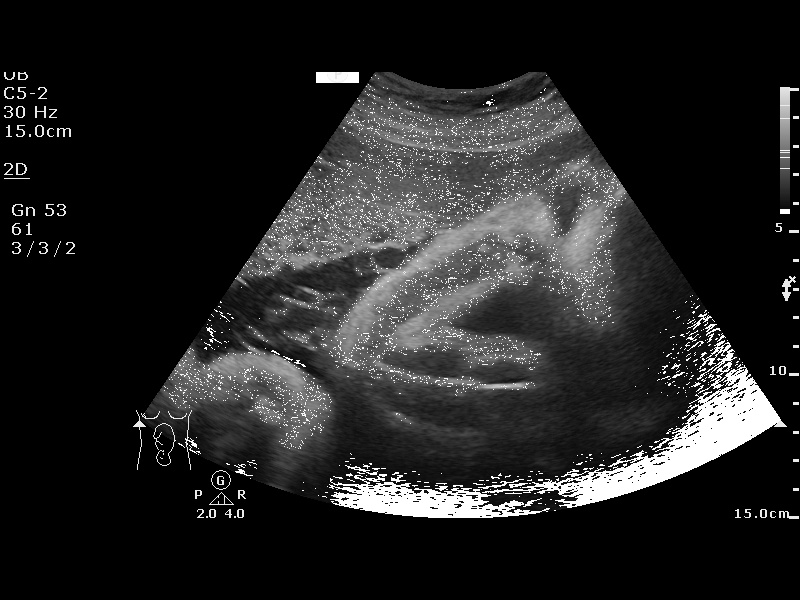
[im 6/10]
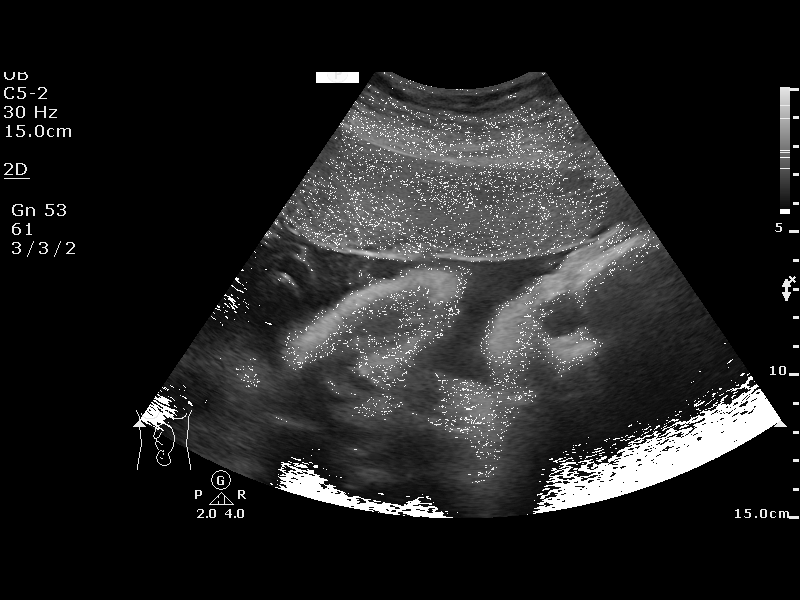
[im 7/10]
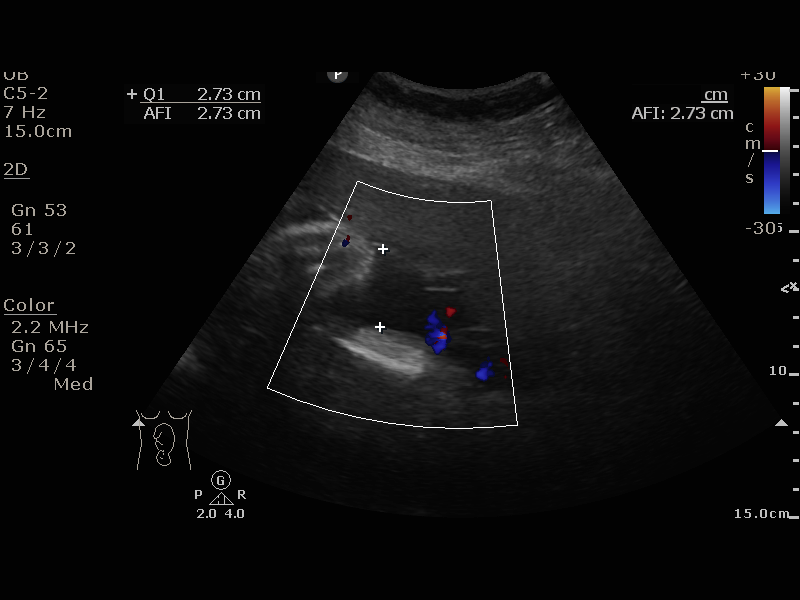
[im 8/10]
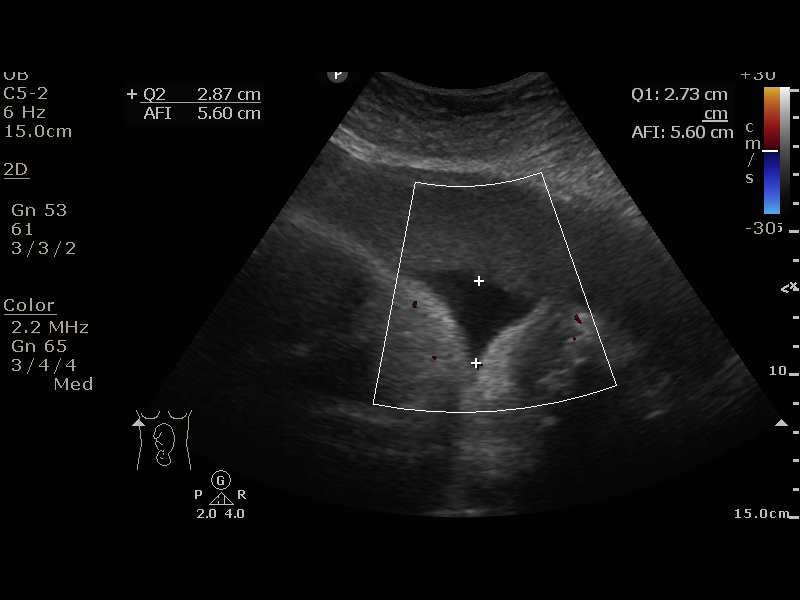
[im 9/10]
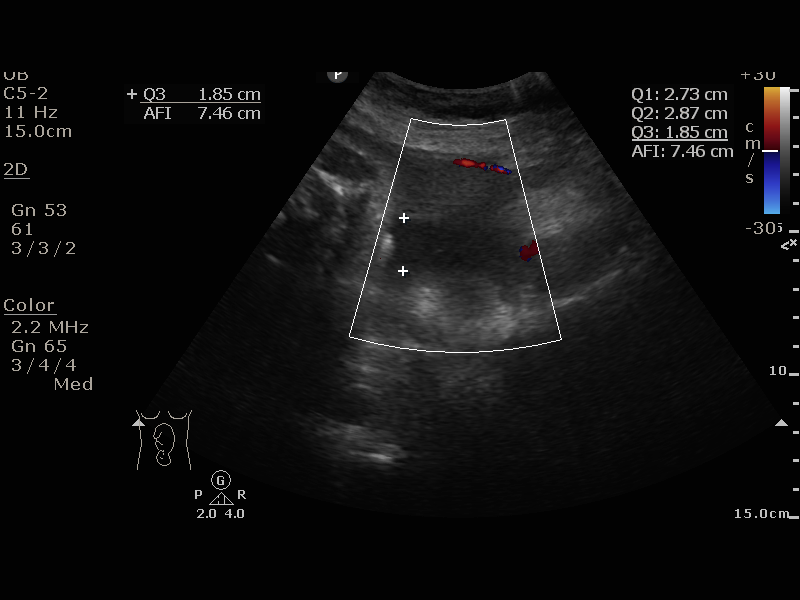
[im 10/10]
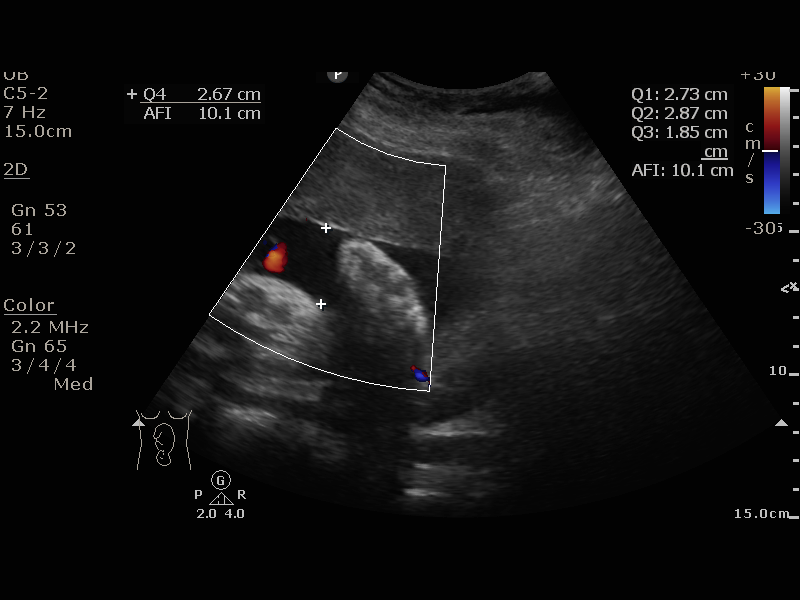

[10 of 10 positions shown; findings below may reference images not displayed]

[facility]
[REDACTED]

1  US FETAL BPP W/NONSTRESS                    76818.4

1  LEGBORSI BANUSO            [id number]      [id number]     [id number]
Service(s) Provided

Indications

36 weeks gestation of pregnancy
Unspecified pre-existing hypertension
complicating pregnancy, third trimester
OB History

Blood Type:            Height:  5'7"   Weight (lb):  218       BMI:
Gravidity:    5         Term:   1        Prem:   0        SAB:   2
TOP:          0       Ectopic:  1        Living: 1
Fetal Evaluation

Num Of Fetuses:     1
Preg. Location:     Intrauterine
Cardiac Activity:   Observed
Presentation:       Cephalic

Amniotic Fluid
AFI FV:      Subjectively within normal limits

AFI Sum(cm)     %Tile       Largest Pocket(cm)
10.12           24
RUQ(cm)       RLQ(cm)       LUQ(cm)        LLQ(cm)
2.73
Biophysical Evaluation

Amniotic F.V:   Pocket => 2 cm two         F. Tone:        Observed
planes
F. Movement:    Observed                   N.S.T:          Reactive
F. Breathing:   Not Observed               Score:          [DATE]
Gestational Age

LMP:           37w 5d        Date:  10/05/16                 EDD:   07/12/17
Best:          36w 6d     Det. By:  U/S C R L  (12/31/16)    EDD:   07/18/17
Impression

Recommendations

Continue with antenatal testing as indicated
